# Patient Record
Sex: Female | Born: 2000 | Race: Black or African American | Hispanic: No | Marital: Single | Smoking: Never smoker
Health system: Southern US, Community
[De-identification: ages and names within clinical notes are randomized; demographics above are authoritative.]

## PROBLEM LIST (undated history)

## (undated) DIAGNOSIS — J302 Other seasonal allergic rhinitis: Secondary | ICD-10-CM

## (undated) DIAGNOSIS — Z789 Other specified health status: Secondary | ICD-10-CM

## (undated) HISTORY — PX: NO PAST SURGERIES: SHX2092

---
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
## 2021-01-02 ENCOUNTER — Other Ambulatory Visit: Payer: Self-pay

## 2021-01-02 ENCOUNTER — Other Ambulatory Visit (HOSPITAL_COMMUNITY)
Admission: RE | Admit: 2021-01-02 | Discharge: 2021-01-02 | Disposition: A | Discharge status: Home / Self Care | Payer: Medicaid Other | Source: Ambulatory Visit | Attending: Family Medicine | Admitting: Family Medicine

## 2021-01-02 ENCOUNTER — Encounter: Payer: Self-pay | Admitting: Family Medicine

## 2021-01-02 ENCOUNTER — Ambulatory Visit (INDEPENDENT_AMBULATORY_CARE_PROVIDER_SITE_OTHER): Payer: Medicaid Other | Admitting: Family Medicine

## 2021-01-02 VITALS — BP 124/84 | HR 87 | Wt 102.1 lb

## 2021-01-02 DIAGNOSIS — Z3490 Encounter for supervision of normal pregnancy, unspecified, unspecified trimester: Secondary | ICD-10-CM | POA: Diagnosis not present

## 2021-01-02 DIAGNOSIS — Z3491 Encounter for supervision of normal pregnancy, unspecified, first trimester: Secondary | ICD-10-CM

## 2021-01-02 LAB — POCT URINALYSIS DIP (DEVICE)
Bilirubin Urine: NEGATIVE
Glucose, UA: NEGATIVE mg/dL
Hgb urine dipstick: NEGATIVE
Ketones, ur: NEGATIVE mg/dL
Leukocytes,Ua: NEGATIVE
Nitrite: NEGATIVE
Protein, ur: NEGATIVE mg/dL
Specific Gravity, Urine: >=1.03 (ref 1.005–1.030)
Urobilinogen, UA: 0.2 mg/dL (ref 0.0–1.0)
pH: 6.5 (ref 5.0–8.0)

## 2021-01-02 NOTE — Progress Notes (Signed)
patient did not leave urine Lab.

## 2021-01-02 NOTE — Progress Notes (Signed)
FHR obtained via informal bedside ultrasound- FHR 171bpm

## 2021-01-02 NOTE — Progress Notes (Signed)
Home Medicaid Form completed  

## 2021-01-02 NOTE — Patient Instructions (Signed)
https://www.cdc.gov/pregnancy/infections.html">  First Trimester of Pregnancy  The first trimester of pregnancy starts on the first day of your last menstrual period until the end of week 12. This is also called months 1 through 3 of pregnancy. Body changes during your first trimester Your body goes through many changes during pregnancy. The changes usually return to normal after your baby is born. Physical changes  You may gain or lose weight.  Your breasts may grow larger and hurt. The area around your nipples may get darker.  Dark spots or blotches may develop on your face.  You may have changes in your hair. Health changes  You may feel like you might vomit (nauseous), and you may vomit.  You may have heartburn.  You may have headaches.  You may have trouble pooping (constipation).  Your gums may bleed. Other changes  You may get tired easily.  You may pee (urinate) more often.  Your menstrual periods will stop.  You may not feel hungry.  You may want to eat certain kinds of food.  You may have changes in your emotions from day to day.  You may have more dreams. Follow these instructions at home: Medicines  Take over-the-counter and prescription medicines only as told by your doctor. Some medicines are not safe during pregnancy.  Take a prenatal vitamin that contains at least 600 micrograms (mcg) of folic acid. Eating and drinking  Eat healthy meals that include: ? Fresh fruits and vegetables. ? Whole grains. ? Good sources of protein, such as meat, eggs, or tofu. ? Low-fat dairy products.  Avoid raw meat and unpasteurized juice, milk, and cheese.  If you feel like you may vomit, or you vomit: ? Eat 4 or 5 small meals a day instead of 3 large meals. ? Try eating a few soda crackers. ? Drink liquids between meals instead of during meals.  You may need to take these actions to prevent or treat trouble pooping: ? Drink enough fluids to keep your pee  (urine) pale yellow. ? Eat foods that are high in fiber. These include beans, whole grains, and fresh fruits and vegetables. ? Limit foods that are high in fat and sugar. These include fried or sweet foods. Activity  Exercise only as told by your doctor. Most people can do their usual exercise routine during pregnancy.  Stop exercising if you have cramps or pain in your lower belly (abdomen) or low back.  Do not exercise if it is too hot or too humid, or if you are in a place of great height (high altitude).  Avoid heavy lifting.  If you choose to, you may have sex unless your doctor tells you not to. Relieving pain and discomfort  Wear a good support bra if your breasts are sore.  Rest with your legs raised (elevated) if you have leg cramps or low back pain.  If you have bulging veins (varicose veins) in your legs: ? Wear support hose as told by your doctor. ? Raise your feet for 15 minutes, 3-4 times a day. ? Limit salt in your food. Safety  Wear your seat belt at all times when you are in a car.  Talk with your doctor if someone is hurting you or yelling at you.  Talk with your doctor if you are feeling sad or have thoughts of hurting yourself. Lifestyle  Do not use hot tubs, steam rooms, or saunas.  Do not douche. Do not use tampons or scented sanitary pads.  Do not   use herbal medicines, illegal drugs, or medicines that are not approved by your doctor. Do not drink alcohol.  Do not smoke or use any products that contain nicotine or tobacco. If you need help quitting, ask your doctor.  Avoid cat litter boxes and soil that is used by cats. These carry germs that can cause harm to the baby and can cause a loss of your baby by miscarriage or stillbirth. General instructions  Keep all follow-up visits. This is important.  Ask for help if you need counseling or if you need help with nutrition. Your doctor can give you advice or tell you where to go for help.  Visit your  dentist. At home, brush your teeth with a soft toothbrush. Floss gently.  Write down your questions. Take them to your prenatal visits. Where to find more information  American Pregnancy Association: americanpregnancy.org  American College of Obstetricians and Gynecologists: www.acog.org  Office on Women's Health: womenshealth.gov/pregnancy Contact a doctor if:  You are dizzy.  You have a fever.  You have mild cramps or pressure in your lower belly.  You have a nagging pain in your belly area.  You continue to feel like you may vomit, you vomit, or you have watery poop (diarrhea) for 24 hours or longer.  You have a bad-smelling fluid coming from your vagina.  You have pain when you pee.  You are exposed to a disease that spreads from person to person, such as chickenpox, measles, Zika virus, HIV, or hepatitis. Get help right away if:  You have spotting or bleeding from your vagina.  You have very bad belly cramping or pain.  You have shortness of breath or chest pain.  You have any kind of injury, such as from a fall or a car crash.  You have new or increased pain, swelling, or redness in an arm or leg. Summary  The first trimester of pregnancy starts on the first day of your last menstrual period until the end of week 12 (months 1 through 3).  Eat 4 or 5 small meals a day instead of 3 large meals.  Do not smoke or use any products that contain nicotine or tobacco. If you need help quitting, ask your doctor.  Keep all follow-up visits. This information is not intended to replace advice given to you by your health care provider. Make sure you discuss any questions you have with your health care provider. Document Revised: 04/30/2020 Document Reviewed: 03/06/2020 Elsevier Patient Education  2021 Elsevier Inc.  

## 2021-01-02 NOTE — Progress Notes (Signed)
     Subjective:   Melissa [name] is a 20 y.o. G1P0 at [redacted]w[redacted]d by LMP being seen today for her first obstetrical visit.  Her obstetrical history is significant for teen pregnancy. Patient unsure if intend to breast feed. Pregnancy history fully reviewed.  Patient reports no complaints.  HISTORY: OB History  Gravida Para Term Preterm AB Living  1 0 0 0 0 0  SAB IAB Ectopic Multiple Live Births  0 0 0 0 0    # Outcome Date GA Lbr Len/2nd Weight Sex Delivery Anes PTL Lv  1 Current            Last pap smear was: n/a, too young  Past Medical History:  Diagnosis Date  . Seasonal allergies    History reviewed. No pertinent surgical history. History reviewed. No pertinent family history. Social History   Tobacco Use  . Smoking status: Never Smoker  . Smokeless tobacco: Never Used  Vaping Use  . Vaping Use: Never used  Substance Use Topics  . Alcohol use: No  . Drug use: No   No Known Allergies Current Outpatient Medications on File Prior to Visit  Medication Sig Dispense Refill  . Prenatal Vit-Fe Fumarate-FA (PREPLUS) 27-1 MG TABS Take 1 tablet by mouth daily.    Marland Kitchen terconazole (TERAZOL 7) 0.4 % vaginal cream Place 1 applicator vaginally at bedtime. (Patient not taking: Reported on 01/02/2021) 45 g 0   No current facility-administered medications on file prior to visit.     Exam   Vitals:   01/02/21 1034  BP: 124/84  Pulse: 87  Weight: 102 lb 1.6 oz (46.3 kg)   Fetal Heart Rate (bpm): 171  Uterus:     System: General: well-developed, well-nourished female in no acute distress   Skin: normal coloration and turgor, no rashes   Neurologic: oriented, normal, negative, normal mood   Extremities: normal strength, tone, and muscle mass, ROM of all joints is normal   HEENT PERRLA, extraocular movement intact and sclera clear, anicteric   Mouth/Teeth mucous membranes moist, pharynx normal without lesions and dental hygiene good   Neck supple and no masses   Respiratory:  no  respiratory distress     Assessment:   Pregnancy: G1P0 Patient Active Problem List   Diagnosis Date Noted  . Supervision of low-risk pregnancy 01/02/2021     Plan:  1. Encounter for supervision of low-risk pregnancy, antepartum Initial labs drawn. Continue prenatal vitamins. Defer prenatal ASA.  Genetic Screening discussed, NIPS: ordered. Ultrasound discussed; fetal anatomic survey: ordered. Problem list reviewed and updated. The nature of [place] [facility] with multiple MDs and other Advanced Practice Providers was explained to patient; also emphasized that residents, students are part of our team. Routine obstetric precautions reviewed. - Genetic Screening - US MFM OB COMP + 14 WK; Future - Culture, OB Urine - [facility] AMB BABYSCRIPTS SCHEDULE OPTIMIZATION - Hemoglobin A1c - CBC/D/Plt+RPR+Rh+ABO+Rub Ab... - GC/Chlamydia probe amp ([facility])not at [facility]   Return in about 4 weeks (around 01/30/2021) for [facility], ob visit.

## 2021-01-03 LAB — CBC/D/PLT+RPR+RH+ABO+RUB AB...
Antibody Screen: NEGATIVE
Basophils Absolute: 0 10*3/uL (ref 0.0–0.2)
Basos: 1 %
EOS (ABSOLUTE): 0 10*3/uL (ref 0.0–0.4)
Eos: 0 %
HCV Ab: <0.1 s/co ratio (ref 0.0–0.9)
HIV Screen 4th Generation wRfx: NONREACTIVE
Hematocrit: 35.3 % (ref 34.0–46.6)
Hemoglobin: 11.6 g/dL (ref 11.1–15.9)
Hepatitis B Surface Ag: NEGATIVE
Immature Grans (Abs): 0 10*3/uL (ref 0.0–0.1)
Immature Granulocytes: 0 %
Lymphocytes Absolute: 1.4 10*3/uL (ref 0.7–3.1)
Lymphs: 18 %
MCH: 26.5 pg — ABNORMAL LOW (ref 26.6–33.0)
MCHC: 32.9 g/dL (ref 31.5–35.7)
MCV: 81 fL (ref 79–97)
Monocytes Absolute: 0.7 10*3/uL (ref 0.1–0.9)
Monocytes: 9 %
Neutrophils Absolute: 5.7 10*3/uL (ref 1.4–7.0)
Neutrophils: 72 %
Platelets: 277 10*3/uL (ref 150–450)
RBC: 4.38 x10E6/uL (ref 3.77–5.28)
RDW: 15.3 % (ref 11.7–15.4)
RPR Ser Ql: NONREACTIVE
Rh Factor: POSITIVE
Rubella Antibodies, IGG: 15.5 index (ref >0.99)
WBC: 7.8 10*3/uL (ref 3.4–10.8)

## 2021-01-03 LAB — HCV INTERPRETATION

## 2021-01-03 LAB — HEMOGLOBIN A1C
Est. average glucose Bld gHb Est-mCnc: 97 mg/dL
Hgb A1c MFr Bld: 5 % (ref 4.8–5.6)

## 2021-01-05 ENCOUNTER — Encounter: Payer: Self-pay | Admitting: *Deleted

## 2021-01-05 LAB — GC/CHLAMYDIA PROBE AMP (~~LOC~~) NOT AT ARMC
Chlamydia: NEGATIVE
Comment: NEGATIVE
Comment: NORMAL
Neisseria Gonorrhea: NEGATIVE

## 2021-01-07 LAB — CULTURE, OB URINE

## 2021-01-07 LAB — URINE CULTURE, OB REFLEX

## 2021-01-13 ENCOUNTER — Encounter: Payer: Self-pay | Admitting: Family Medicine

## 2021-01-30 ENCOUNTER — Encounter: Payer: Self-pay | Admitting: *Deleted

## 2021-01-30 ENCOUNTER — Ambulatory Visit (INDEPENDENT_AMBULATORY_CARE_PROVIDER_SITE_OTHER): Payer: Medicaid Other | Admitting: Advanced Practice Midwife

## 2021-01-30 ENCOUNTER — Other Ambulatory Visit: Payer: Self-pay

## 2021-01-30 VITALS — BP 109/64 | HR 78 | Wt 105.0 lb

## 2021-01-30 DIAGNOSIS — Z3A15 15 weeks gestation of pregnancy: Secondary | ICD-10-CM

## 2021-01-30 DIAGNOSIS — Z3402 Encounter for supervision of normal first pregnancy, second trimester: Secondary | ICD-10-CM

## 2021-01-30 DIAGNOSIS — Z23 Encounter for immunization: Secondary | ICD-10-CM | POA: Diagnosis not present

## 2021-01-30 MED ORDER — BLOOD PRESSURE KIT DEVI: 1.0000 | 0 refills | Status: DC | PRN

## 2021-01-30 NOTE — Patient Instructions (Signed)

## 2021-01-30 NOTE — Progress Notes (Signed)
Here for return ob. Would like flu vaccine. Has not signed up for [employer] yet, but will today. Has not received bp cuff yet; sent order in today to [facility] Charnell Peplinski,RN

## 2021-01-30 NOTE — Progress Notes (Signed)
   PRENATAL VISIT NOTE  Subjective:  Melissa [name] is a 20 y.o. G1P0 at [redacted]w[redacted]d being seen today for ongoing prenatal care.  She is currently monitored for the following issues for this low-risk pregnancy and has Supervision of low-risk pregnancy on their problem list.  Patient reports no complaints.  Contractions: Not present. Vag. Bleeding: None.  Movement: Absent. Denies leaking of fluid.   The following portions of the patient's history were reviewed and updated as appropriate: allergies, current medications, past family history, past medical history, past social history, past surgical history and problem list. Problem list updated.  Objective:   Vitals:   01/30/21 1000  BP: 109/64  Pulse: 78  Weight: 105 lb (47.6 kg)    Fetal Status: Fetal Heart Rate (bpm): 148   Movement: Absent     General:  Alert, oriented and cooperative. Patient is in no acute distress.  Skin: Skin is warm and dry. No rash noted.   Cardiovascular: Normal heart rate noted  Respiratory: Normal respiratory effort, no problems with respiration noted  Abdomen: Soft, gravid, appropriate for gestational age.  Pain/Pressure: Absent     Pelvic: Cervical exam deferred        Extremities: Normal range of motion.  Edema: None  Mental Status: Normal mood and affect. Normal behavior. Normal judgment and thought content.   Assessment and Plan:  Pregnancy: G1P0 at [redacted]w[redacted]d  1. Supervision of normal first teen pregnancy in second trimester - Routine care, declined AFP - Anatomy scan scheduled - Blood Pressure Monitoring (BLOOD PRESSURE KIT) DEVI; 1 Device by Does not apply route as needed.  Dispense: 1 each; Refill: 0 - Flu Vaccine QUAD 36+ mos IM  2. [redacted] weeks gestation of pregnancy   Preterm labor symptoms and general obstetric precautions including but not limited to vaginal bleeding, contractions, leaking of fluid and fetal movement were reviewed in detail with the patient. Please refer to After Visit Summary for  other counseling recommendations.  Return in about 4 weeks (around 02/27/2021).  Future Appointments  Date Time Provider [place]  02/27/2021 10:15 AM Danielle Rankin [facility] [place], [place]

## 2021-02-06 ENCOUNTER — Encounter: Payer: Self-pay | Admitting: *Deleted

## 2021-02-15 ENCOUNTER — Other Ambulatory Visit: Payer: Self-pay

## 2021-02-15 ENCOUNTER — Inpatient Hospital Stay (HOSPITAL_COMMUNITY)
Admission: AD | Admit: 2021-02-15 | Discharge: 2021-02-15 | Disposition: A | Discharge status: Home / Self Care | Payer: Medicaid Other | Attending: Family Medicine | Admitting: Family Medicine

## 2021-02-15 ENCOUNTER — Encounter (HOSPITAL_COMMUNITY): Payer: Self-pay | Admitting: Family Medicine

## 2021-02-15 ENCOUNTER — Inpatient Hospital Stay (HOSPITAL_BASED_OUTPATIENT_CLINIC_OR_DEPARTMENT_OTHER): Payer: Medicaid Other

## 2021-02-15 DIAGNOSIS — Z3A18 18 weeks gestation of pregnancy: Secondary | ICD-10-CM | POA: Diagnosis not present

## 2021-02-15 DIAGNOSIS — B3731 Acute candidiasis of vulva and vagina: Secondary | ICD-10-CM

## 2021-02-15 DIAGNOSIS — O26892 Other specified pregnancy related conditions, second trimester: Secondary | ICD-10-CM | POA: Insufficient documentation

## 2021-02-15 DIAGNOSIS — R1031 Right lower quadrant pain: Secondary | ICD-10-CM | POA: Diagnosis not present

## 2021-02-15 DIAGNOSIS — B379 Candidiasis, unspecified: Secondary | ICD-10-CM | POA: Diagnosis not present

## 2021-02-15 DIAGNOSIS — O321XX0 Maternal care for breech presentation, not applicable or unspecified: Secondary | ICD-10-CM

## 2021-02-15 DIAGNOSIS — O98812 Other maternal infectious and parasitic diseases complicating pregnancy, second trimester: Secondary | ICD-10-CM | POA: Diagnosis not present

## 2021-02-15 DIAGNOSIS — R102 Pelvic and perineal pain: Secondary | ICD-10-CM

## 2021-02-15 DIAGNOSIS — O26899 Other specified pregnancy related conditions, unspecified trimester: Secondary | ICD-10-CM

## 2021-02-15 DIAGNOSIS — R1030 Lower abdominal pain, unspecified: Secondary | ICD-10-CM | POA: Diagnosis not present

## 2021-02-15 DIAGNOSIS — B373 Candidiasis of vulva and vagina: Secondary | ICD-10-CM | POA: Diagnosis not present

## 2021-02-15 DIAGNOSIS — Z3492 Encounter for supervision of normal pregnancy, unspecified, second trimester: Secondary | ICD-10-CM

## 2021-02-15 HISTORY — DX: Other specified health status: Z78.9

## 2021-02-15 LAB — URINALYSIS, ROUTINE W REFLEX MICROSCOPIC
Bilirubin Urine: NEGATIVE
Glucose, UA: NEGATIVE mg/dL
Hgb urine dipstick: NEGATIVE
Ketones, ur: NEGATIVE mg/dL
Leukocytes,Ua: NEGATIVE
Nitrite: NEGATIVE
Protein, ur: NEGATIVE mg/dL
Specific Gravity, Urine: 1.018 (ref 1.005–1.030)
pH: 6 (ref 5.0–8.0)

## 2021-02-15 LAB — WET PREP, GENITAL
Clue Cells Wet Prep HPF POC: NONE SEEN
Sperm: NONE SEEN
Trich, Wet Prep: NONE SEEN

## 2021-02-15 MED ORDER — TERCONAZOLE 0.4 % VA CREA: 1.0000 | TOPICAL_CREAM | Freq: Every day | VAGINAL | 1 refills | Status: AC

## 2021-02-15 NOTE — MAU Note (Signed)
Pt presents to MAU via EMS with complaint of lower abdominal pain that started on Friday (02/13/21) @ 0300; only worse when laying on side or back. No LOF or vaginal bleeding reported. +FM reported. Pain rated 7 on 1-10 scale.

## 2021-02-15 NOTE — Discharge Instructions (Signed)
Round Ligament Pain    The round ligament is a cord of muscle and tissue that helps support the uterus. It can become a source of pain during pregnancy if it becomes stretched or twisted as the baby grows. The pain usually begins in the second trimester (13-28 weeks) of pregnancy, and it can come and go until the baby is delivered. It is not a serious problem, and it does not cause harm to the baby.  Round ligament pain is usually a short, sharp, and pinching pain, but it can also be a dull, lingering, and aching pain. The pain is felt in the lower side of the abdomen or in the groin. It usually starts deep in the groin and moves up to the outside of the hip area. The pain may occur when you:  · Suddenly change position, such as quickly going from a sitting to standing position.  · Roll over in bed.  · Cough or sneeze.  · Do physical activity.  Follow these instructions at home:    · Watch your condition for any changes.  · When the pain starts, relax. Then try any of these methods to help with the pain:  ? Sitting down.  ? Flexing your knees up to your abdomen.  ? Lying on your side with one pillow under your abdomen and another pillow between your legs.  ? Sitting in a warm bath for 15-20 minutes or until the pain goes away.  · Take over-the-counter and prescription medicines only as told by your health care provider.  · Move slowly when you sit down or stand up.  · Avoid long walks if they cause pain.  · Stop or reduce your physical activities if they cause pain.  · Keep all follow-up visits as told by your health care provider. This is important.  Contact a health care provider if:  · Your pain does not go away with treatment.  · You feel pain in your back that you did not have before.  · Your medicine is not helping.  Get help right away if:  · You have a fever or chills.  · You develop uterine contractions.  · You have vaginal bleeding.  · You have nausea or vomiting.  · You have diarrhea.  · You have pain  when you urinate.  Summary  · Round ligament pain is felt in the lower abdomen or groin. It is usually a short, sharp, and pinching pain. It can also be a dull, lingering, and aching pain.  · This pain usually begins in the second trimester (13-28 weeks). It occurs because the uterus is stretching with the growing baby, and it is not harmful to the baby.  · You may notice the pain when you suddenly change position, when you cough or sneeze, or during physical activity.  · Relaxing, flexing your knees to your abdomen, lying on one side, or taking a warm bath may help to get rid of the pain.  · Get help from your health care provider if the pain does not go away or if you have vaginal bleeding, nausea, vomiting, diarrhea, or painful urination.  This information is not intended to replace advice given to you by your health care provider. Make sure you discuss any questions you have with your health care provider.  Document Revised: 05/10/2018 Document Reviewed: 05/10/2018  Elsevier Patient Education © 2021 [employer]      Vaginal Yeast Infection, Adult    Vaginal yeast infection is   a condition that causes vaginal discharge as well as soreness, swelling, and redness (inflammation) of the vagina. This is a common condition. Some women get this infection frequently.  What are the causes?  This condition is caused by a change in the normal balance of the yeast (candida) and bacteria that live in the vagina. This change causes an overgrowth of yeast, which causes the inflammation.  What increases the risk?  The condition is more likely to develop in women who:  · Take antibiotic medicines.  · Have diabetes.  · Take birth control pills.  · Are pregnant.  · Douche often.  · Have a weak body defense system (immune system).  · Have been taking steroid medicines for a long time.  · Frequently wear tight clothing.  What are the signs or symptoms?  Symptoms of this condition include:  · White, thick, creamy vaginal  discharge.  · Swelling, itching, redness, and irritation of the vagina. The lips of the vagina (vulva) may be affected as well.  · Pain or a burning feeling while urinating.  · Pain during sex.  How is this diagnosed?  This condition is diagnosed based on:  · Your medical history.  · A physical exam.  · A pelvic exam. Your health care provider will examine a sample of your vaginal discharge under a microscope. Your health care provider may send this sample for testing to confirm the diagnosis.  How is this treated?  This condition is treated with medicine. Medicines may be over-the-counter or prescription. You may be told to use one or more of the following:  · Medicine that is taken by mouth (orally).  · Medicine that is applied as a cream (topically).  · Medicine that is inserted directly into the vagina (suppository).  Follow these instructions at home:    Lifestyle  · Do not have sex until your health care provider approves. Tell your sex partner that you have a yeast infection. That person should go to his or her health care provider and ask if they should also be treated.  · Do not wear tight clothes, such as pantyhose or tight pants.  · Wear breathable cotton underwear.  General instructions  · Take or apply over-the-counter and prescription medicines only as told by your health care provider.  · Eat more yogurt. This may help to keep your yeast infection from returning.  · Do not use tampons until your health care provider approves.  · Try taking a sitz bath to help with discomfort. This is a warm water bath that is taken while you are sitting down. The water should only come up to your hips and should cover your buttocks. Do this 3-4 times per day or as told by your health care provider.  · Do not douche.  · If you have diabetes, keep your blood sugar levels under control.  · Keep all follow-up visits as told by your health care provider. This is important.  Contact a health care provider if:  · You have a  fever.  · Your symptoms go away and then return.  · Your symptoms do not get better with treatment.  · Your symptoms get worse.  · You have new symptoms.  · You develop blisters in or around your vagina.  · You have blood coming from your vagina and it is not your menstrual period.  · You develop pain in your abdomen.  Summary  · Vaginal yeast infection is a condition that causes   provider. Make sure you discuss any questions you have with your health care provider. Document Revised: 06/22/2019 Document Reviewed: 04/10/2018 Elsevier Patient Education  2021 [employer].

## 2021-02-15 NOTE — MAU Provider Note (Signed)
Chief Complaint: Abdominal Pain   Event Date/Time   First Provider Initiated Contact with Patient 02/15/21 1107     SUBJECTIVE HPI: Melissa [name] is a 20 y.o. G1P0 at [redacted]w[redacted]d who presents to Maternity Admissions reporting bilateral lower abdominal pain and pressure since 02/14/2021 at 3 AM.  Location: Bilateral abdomen Quality: Pressure lying still and sharp pains with movement Severity: 8/10 on pain scale at worst 5/10 now. Duration: 30 hours Context: [redacted] weeks gestation.  Uncomplicated pregnancy Timing: Intermittent Modifying factors: Worse with lying down.  Improved with ambulation. Associated signs and symptoms: Negative for fever, chills, urinary complaints, GI complaints, vaginal bleeding or vaginal discharge.  Past Medical History:  Diagnosis Date  . Medical history non-contributory   . Seasonal allergies    OB History  Gravida Para Term Preterm AB Living  1            SAB IAB Ectopic Multiple Live Births               # Outcome Date GA Lbr Len/2nd Weight Sex Delivery Anes PTL Lv  1 Current            Past Surgical History:  Procedure Laterality Date  . NO PAST SURGERIES     Social History   Socioeconomic History  . Marital status: Single    Spouse name: Not on file  . Number of children: Not on file  . Years of education: Not on file  . Highest education level: Not on file  Occupational History  . Not on file  Tobacco Use  . Smoking status: Never Smoker  . Smokeless tobacco: Never Used  Vaping Use  . Vaping Use: Never used  Substance and Sexual Activity  . Alcohol use: No  . Drug use: No  . Sexual activity: Yes    Birth control/protection: None  Other Topics Concern  . Not on file  Social History Narrative  . Not on file   Social Determinants of Health   Financial Resource Strain: Not on file  Food Insecurity: No Food Insecurity  . Worried About Charity fundraiser in the Last Year: Never true  . Ran Out of Food in the Last Year: Never true   Transportation Needs: No Transportation Needs  . Lack of Transportation (Medical): No  . Lack of Transportation (Non-Medical): No  Physical Activity: Not on file  Stress: Not on file  Social Connections: Not on file  Intimate Partner Violence: Not At Risk  . Fear of Current or Ex-Partner: No  . Emotionally Abused: No  . Physically Abused: No  . Sexually Abused: No   History reviewed. No pertinent family history. No current facility-administered medications on file prior to encounter.   Current Outpatient Medications on File Prior to Encounter  Medication Sig Dispense Refill  . Prenatal Vit-Fe Fumarate-FA (PREPLUS) 27-1 MG TABS Take 1 tablet by mouth daily.    . Blood Pressure Monitoring (BLOOD PRESSURE KIT) DEVI 1 Device by Does not apply route as needed. 1 each 0   No Known Allergies  I have reviewed patient's Past Medical Hx, Surgical Hx, Family Hx, Social Hx, medications and allergies.   Review of Systems  Constitutional: Negative for appetite change, chills and fever.  Gastrointestinal: Positive for abdominal pain. Negative for abdominal distention, constipation, diarrhea, nausea and vomiting.  Genitourinary: Positive for pelvic pain. Negative for difficulty urinating, dyspareunia, dysuria, flank pain, frequency, hematuria, urgency, vaginal bleeding and vaginal discharge.  Musculoskeletal: Negative for back pain.  OBJECTIVE Patient Vitals for the past 24 hrs:  BP Temp Temp src Pulse Resp SpO2 Height Weight  02/15/21 1057 102/70 98.2 F (36.8 C) Oral 77 18 100 % -- --  02/15/21 1056 -- -- -- -- -- -- $Rem'5\' 1"'LCTZ$  (1.549 m) 47.2 kg  02/15/21 1040 101/62 98.8 F (37.1 C) Oral 70 19 100 % -- --   Constitutional: Well-developed, well-nourished female in no acute distress.  Cardiovascular: normal rate Respiratory: normal rate and effort.  GI: Abd soft, non-tender, gravid appropriate for gestational age. Pos BS x 4 MS: Extremities nontender, no edema, normal ROM Neurologic:  Alert and oriented x 4.  GU: Neg CVAT.  SPECULUM EXAM: NEFG, small amount of thick, white, odorless discharge, no blood noted, cervix clean  BIMANUAL: cervix closed, unable to assess placement; uterus 18-week size, moderate right adnexal tenderness no mass.  No left adnexal tenderness or masses. No CMT.  LAB RESULTS Results for orders placed or performed during the hospital encounter of 02/15/21 (from the past 24 hour(s))  Urinalysis, Routine w reflex microscopic Urine, Clean Catch     Status: Abnormal   Collection Time: 02/15/21 11:45 AM  Result Value Ref Range   Color, Urine YELLOW YELLOW   APPearance HAZY (A) CLEAR   Specific Gravity, Urine 1.018 1.005 - 1.030   pH 6.0 5.0 - 8.0   Glucose, UA NEGATIVE NEGATIVE mg/dL   Hgb urine dipstick NEGATIVE NEGATIVE   Bilirubin Urine NEGATIVE NEGATIVE   Ketones, ur NEGATIVE NEGATIVE mg/dL   Protein, ur NEGATIVE NEGATIVE mg/dL   Nitrite NEGATIVE NEGATIVE   Leukocytes,Ua NEGATIVE NEGATIVE  Wet prep, genital     Status: Abnormal   Collection Time: 02/15/21 11:48 AM   Specimen: PATH Cytology Cervicovaginal Ancillary Only; Genital  Result Value Ref Range   Yeast Wet Prep HPF POC PRESENT (A) NONE SEEN   Trich, Wet Prep NONE SEEN NONE SEEN   Clue Cells Wet Prep HPF POC NONE SEEN NONE SEEN   WBC, Wet Prep HPF POC MANY (A) NONE SEEN   Sperm NONE SEEN     IMAGING  Media Information             MAU COURSE Orders Placed This Encounter  Procedures  . Wet prep, genital  . [place] MFM OB TRANSVAGINAL  . [place] MFM OB Limited  . Urinalysis, Routine w reflex microscopic Urine, Clean Catch  . Discharge patient   Meds ordered this encounter  Medications  . terconazole (TERAZOL 7) 0.4 % vaginal cream    Sig: Place 1 applicator vaginally at bedtime for 7 days.    Dispense:  45 g    Refill:  1    X 7 days    Order Specific Question:   Supervising Provider    Answer:   Donnamae Jude [[id number]]    MDM -18.1 weeks with intermittent lower  abdominal pain most likely due to round ligament pain and possible sporadic contractions (contraction observed on ultrasound).  No evidence of cervical shortening, active preterm labor or other emergent obstetric condition.  Normal ovaries, UA.  Positive for yeast.  Will treat with Terazol 7.  Preterm labor precautions reviewed.  ASSESSMENT 1. Vaginal yeast infection   2. RLQ abdominal pain   3. Abdominal pain during pregnancy in second trimester   4. Pain of round ligament affecting pregnancy, antepartum   5. Encounter for supervision of low-risk pregnancy in second trimester     PLAN Discharge home in stable condition. Second trimester and preterm  labor precautions Push fluids.  Measures, Tylenol, ibuprofen as needed (do not use ibuprofen greater than 3 days or after 28 weeks)  [place] [facility] Follow up on 02/24/2021.   Specialty: Obstetrics and Gynecology Why: as scheduled or sooner as needed if symptoms worsen Contact information: [address] [place] [address] [phone]       [facility] Follow up.   Specialty: Obstetrics and Gynecology Why: as needed in pregnancy emergencies Contact information: [address] [phone] [place] [phone]             Allergies as of 02/15/2021   No Known Allergies     Medication List    TAKE these medications   Blood Pressure Kit Devi 1 Device by Does not apply route as needed.   PrePLUS 27-1 MG Tabs Take 1 tablet by mouth daily.   terconazole 0.4 % vaginal cream Commonly known as: Terazol 7 Place 1 applicator vaginally at bedtime for 7 days.        Tamala Julian, [place], [place] 02/15/2021  1:32 PM

## 2021-02-16 LAB — GC/CHLAMYDIA PROBE AMP (~~LOC~~) NOT AT ARMC
Chlamydia: NEGATIVE
Comment: NEGATIVE
Comment: NORMAL
Neisseria Gonorrhea: NEGATIVE

## 2021-02-24 ENCOUNTER — Other Ambulatory Visit: Payer: Self-pay

## 2021-02-24 ENCOUNTER — Other Ambulatory Visit: Payer: Self-pay | Admitting: *Deleted

## 2021-02-24 ENCOUNTER — Ambulatory Visit: Payer: Medicaid Other | Attending: Family Medicine

## 2021-02-24 DIAGNOSIS — Z3490 Encounter for supervision of normal pregnancy, unspecified, unspecified trimester: Secondary | ICD-10-CM | POA: Insufficient documentation

## 2021-02-24 DIAGNOSIS — Z3400 Encounter for supervision of normal first pregnancy, unspecified trimester: Secondary | ICD-10-CM

## 2021-02-27 ENCOUNTER — Encounter: Payer: Self-pay | Admitting: Medical

## 2021-02-27 ENCOUNTER — Ambulatory Visit (INDEPENDENT_AMBULATORY_CARE_PROVIDER_SITE_OTHER): Payer: Medicaid Other | Admitting: Medical

## 2021-02-27 ENCOUNTER — Other Ambulatory Visit: Payer: Self-pay

## 2021-02-27 VITALS — BP 118/77 | HR 86 | Wt 113.8 lb

## 2021-02-27 DIAGNOSIS — Z3490 Encounter for supervision of normal pregnancy, unspecified, unspecified trimester: Secondary | ICD-10-CM

## 2021-02-27 DIAGNOSIS — Z3A19 19 weeks gestation of pregnancy: Secondary | ICD-10-CM

## 2021-02-27 NOTE — Patient Instructions (Addendum)
AREA PEDIATRIC/FAMILY PRACTICE PHYSICIANS  [place] ([address]) . [facility] o Chambliss, MD; Eniola, MD; Hale, MD; Hensel, MD; McDiarmid, MD; McIntyer, MD; Neal, MD; Walden, MD o [address] [place], [place] [address] o [phone] o Mon-Fri 8:30-12:30, 1:30-5:00 o Providers come to see babies at [facility] o Accepting Medicaid . [facility] o Limited providers who accept newborns: Koirala, MD; Morrow, MD; Wolters, MD o [address], [place], [place] [address] o [phone] o Mon-Fri 8:00-5:30 o Babies seen by providers at [facility] o Does NOT accept Medicaid o Please call early in hospitalization for appointment (limited availability)  . [facility] o Mulberry, MD o [address] [place], [place] [address] o [phone] o Mon, Tue, Thur, Fri 8:30-5:00, Wed 10:00-7:00 (closed 1-2pm) o Babies seen by [facility] providers o Accepting Medicaid . [facility] - Pediatrician o Rubin, MD o [address], [place], [place] [address] o [phone] o Mon-Fri 8:30-5:00, Sat 8:30-12:00 o Provider comes to see babies at [facility] o Accepting Medicaid o Must have been referred from current patients or contacted office prior to delivery . [facility] ([facility]) o Brown, MD; Chandler, MD; Ettefagh, MD; Grant, MD; Lester, MD; McCormick, MD; McQueen, MD; Prose, MD; Simha, MD; Stanley, MD; Stryffeler, NP; Tebben, NP o [address], [place], [place] [address] o [phone] o Mon, Tue, Thur, Fri 8:30-5:30, Wed 9:30-5:30, Sat 8:30-12:30 o Babies seen by [facility] providers o Accepting Medicaid o Only accepting infants of first-time parents or siblings of current patients o Hospital discharge coordinator will make follow-up appointment . Jack Amos o [address],  [place], [place]  [address] o [phone]   Fax - [phone] . [facility] o [address], [place], [place]  [address] o Phone - [phone]   Fax - [phone] . Shilpa Gosrani o [address], [place], [place]  [address] o [phone]  [place] ([address]) . [facility] o Bates, MD; Brassfield, MD; Cooper, Cox, MD; MD; Davis, MD; Dovico, MD; Ettefaugh, MD; Little, MD; Lowe, MD; Keiffer, MD; Melvin, MD; Sumner, MD; Williams, MD o [address], [place], [place] [address] o [phone] o Mon-Fri 8:30-5:00 (extended evenings Mon-Thur as needed), Sat-Sun 10:00-1:00 o Providers come to see babies at [facility] o Accepting Medicaid for families of first-time babies and families with all children in the household age 3 and under. Must register with office prior to making appointment (M-F only). . [facility] o Henson, NP; Knapp, MD; Lalonde, MD; Tysinger, PA o [address] [place], [place] [address] o [phone] o Mon-Fri 8:00-5:00 o Babies seen by providers at [facility] o Does NOT accept Medicaid/Commercial Insurance Only . [facility]  o Artis, MD; Barnes, MD; Bratton, MD; Coccaro, MD; Lockett Gardner, MD; Kramer, MD; Marshall, MD; Netherton, MD; Poleto, MD; Skinner, MD o [address] [place], [place] [address] o [phone] o Mon-Fri 8:30-5:30, Sat (Oct.-Mar.) 9:00-1:00 o Babies seen by providers at [facility] o Accepting Medicaid  [place] ([address]) . [facility] o Reid, MD; Warner, MD o [address], [place],  [address] o [phone] o Mon-Fri 8:30-5:00, Sat 8:30-12:00 o Providers come to see babies at [facility] o Does NOT accept Medicaid . [facility] o Becker, PA; Hagler, MD; Scifres, PA; Sun, MD; Swayne, MD o [address],  [place], [place] [address] o [phone] o Mon-Fri 8:00-5:00 o Babies seen by providers at [facility] o Does NOT accept Medicaid o Only accepting babies of parents who are patients o Please call early in hospitalization for appointment (limited availability) . [facility] o Clark, MD; Frye, MD; Kelleher, MD; Mack, NP; Miller, MD; O'Keller, MD; Patterson, NP; Pudlo, MD; Puzio, MD; Thomas, MD; Tucker, MD; Twiselton, MD o [address], [place], [place] [address] o [phone] o Mon-Fri 8:00-5:00, Sat 9:00-12:00 o Providers come to see babies at [facility] o Does NOT accept Medicaid  [place] ([address]) . [facility] o Limited providers accepting new patients: Brake, NP; Wharton, PA o [address], [place], [place] [address] o [phone] o Mon-Fri 8:00-5:00 o Babies seen by providers at [facility] o Does NOT accept Medicaid o Only accepting babies of parents who are patients o Please call early in hospitalization for appointment (limited availability) . [facility] o Gay, MD; Quinlan, MD o [address] [place], [place] [address] o [phone] (press 1 to schedule appointment) o Mon-Fri 8:00-5:00 o Providers come to see babies at [facility] o Does NOT accept Medicaid . [facility] o Mazer, MD o [address] [place], [place] [address] o [phone] o Mon-Fri 8:30-5:00 (lunch 12:30-1:00), extended hours by appointment only Wed 5:00-6:30 o Babies seen by [facility] providers o Accepting Medicaid . [facility] o Banks, MD; Jordan, MD; Koberlein, MD o [address], [place], [place] [address] o [phone] o Mon-Fri 8:00-5:00 o Babies seen by [facility] providers o Does NOT accept Medicaid . [facility] o Parker, MD; Hunter, MD; Wallace, DO o [address] [place], [place]  [address] o [phone] o Mon-Fri 8:00-5:00 o Babies seen by [facility] providers o Does NOT accept Medicaid . [facility] o Brandon, PA; Brecken, PA; Christy, NP; Dees, MD; DeClaire, MD; DeWeese, MD; Hansen, NP; Mills, NP; Parrish, NP; Smoot, NP; Summer, MD; Vapne, MD o [address] [place], [place] [address] o [phone] o Mon-Fri 8:30-5:00, Sat 10:00-1:00 o Providers come to see babies at [facility] o Does NOT accept Medicaid o Free prenatal information session Tuesdays at 4:45pm . [facility] o Bouska, MD; Gordon, PA; Jeffery, PA; Weber, PA o [address] [place] [address] o [phone] o Mon-Fri 7:30-5:30 o Babies seen by [facility] providers . [facility] o [address], [place], [place], [place]  [address] o [phone]   Fax - [phone]  [place] ([phone]) . [facility] o Reese, MD o [address] [place], [place] [address] o [phone] o Mon-Thur 8:00-6:00 o Providers come to see babies at [facility] o Accepting Medicaid . [facility] o Anderson, NP; Badger, MD; Beal, PA; Spencer, PA o [address] [place],  [address] o [phone] o Mon-Thur 7:30-7:30, Fri 7:30-4:30 o Babies seen by [facility] providers o Accepting Medicaid . [facility] o Agbuya, MD; Klett, NP; Romgoolam, MD o [address], ,  [address] o [phone] o Mon-Fri 8:30-5:00, Sat 8:30-12:00 o Providers come to see babies at [facility] o Accepting Medicaid o Must have "Meet & Greet" appointment at office prior to delivery . [facility]  ([facility] ) o McCord,   MD; Juleen China, MD; Clydene Laming, [address], [place], [place] [address] o [phone] o Mon-Wed 8:00-6:00, Thur-Fri 8:00-5:00, Sat 9:00-12:00 o Providers come to  see babies at [facility] o Does NOT accept Medicaid o Only accepting siblings of current patients . [facility] [place], [place], [place], [place]  [address] o [phone]   Fax [phone] . [facility] [place] [address], [place], [place]  [address] o [phone]   Fax - Morton Gorman [phone] & [phone]) . Therapist, music at [place], DO; [place], [address] [place], [place] [address] o [phone] o Mon-Fri 7:00-5:00 o Babies seen by [facility] providers o Does NOT accept Medicaid . [place], MD; [place], [place]; [place], [place], [place], [place] [address] o [phone] o Mon-Fri 8:00-5:00 o Babies seen by [facility] providers o Accepting Medicaid . Lamont, MD; [place], [place]; [place], NP; [place], [place], [place], [place] [address] o [phone] o Mon-Fri 8:00-5:00 o Babies seen by providers at [facility] [place] [phone]) . [facility] [place], [place] o [address] [place], [place] [address] o [phone] o Mon-Fri 8:00-5:00 o Babies seen by [facility] providers o Does NOT accept Medicaid o Limited availability, please call early in hospitalization to schedule follow-up . [facility] Leilani Merl, PA; Maisie Fus, MD; [place], MD; [place], [place]; Jeannine Kitten, MD; [place], [place] [facility] [address], [place], [place] [address] o [phone] o Mon-Fri 8:30-5:00, Sat 9:00-12:00 o Babies seen by providers at [facility] o Accepting Medicaid o Please register online then schedule online or call office o [email or web address] . [place] [facility]  [place]) Kristian Covey, NP; Dwyane Dee, MD; Leonidas Romberg, PA o [address] [place], [place], [place] [address] o [phone] o Mon-Fri 8:00-5:00 o Babies seen by providers at [facility] o Accepting Medicaid . [place] [facility]) Dairl Ponder, MD; Rayvon Char, NP; Melina Modena, MD o [address] [place], [place], [place] [address] o [phone] o Mon-Fri 8:00-5:30, Sat&Sun by appointment (phones open at 8:30) o Babies seen by [facility] providers o Accepting Medicaid o Must be a first-time baby or sibling of current patient . [place], [address], [place], [place]  [address] o [phone]   Fax - [phone]  [place] [phone] & [phone]) . [place], [place]; [place], [place]; Benjamine Mola, MD; [place], [place]; Harrell Lark, MD o [address] [place], [place] [address] o [phone] o Mon-Thur 8:00-7:00, Fri 8:00-5:00, Sat 8:00-12:00, Sun 9:00-12:00 o Babies seen by [facility] providers o Accepting Medicaid . [facility], MD; Ruthann Cancer, MD; [facility], MD o [address], [place], [place] [address] o [phone] o Mon-Thur 8:00-5:00 o Babies seen by providers at [facility] o Accepting Medicaid . [facility] [place], MD; Coe-Goins, MD; Amedeo Plenty, MD; Bobby Rumpf, MD; List, MD; Lavonia Drafts, MD; Ruthann Cancer, MD; Selinda Eon, MD; Audie Box, MD; Jim Like, MD; Christie Nottingham, MD; Hubbard Hartshorn, MD; Modena Nunnery, MD o [address] [place], [place]  [address] o [phone] o Mon-Fri 8:00-5:30, Sat (Oct.-Mar.) 9:00-1:00 o Babies seen by providers at [facility] o Accepting Medicaid o Must fill out new patient packet, available online at [email or web address] . [facility] [place] [facility]) Simone Curia, NP; Tiburcio Pea, NP; Tresa Endo, NP; Whitney Post, MD;  [place], [place]; Hennie Duos, MD; Wynne Dust, MD; Kavin Leech, NP o [address], [place], [place] [address] o [phone] o Mon-Thur 8:00-5:30, Fri 8:00-5:00 o Babies seen by providers at [facility] o Accepting [facility] [phone]) . [facility] o [place], [place]; Lamont, MD; Tanya Nones, MD; [place], [place] o [address] [place], [place] [address] o [phone] o Mon-Fri 8:00-5:00 o Babies seen by providers at [facility] o Accepting [facility] [phone]) . [facility] o [place], DO; Lenise Arena, MD; [place], [place] o [address], [place], [place] [address] o [phone] o Mon-Fri 8:00-5:00 o Babies seen by providers at [facility] o Does NOT accept Medicaid o Limited appointment availability, please call early in hospitalization  . Nature conservation officer at [facility] o Oneida, DO; Hayward, MD o [address], [place], [place] [address] o [phone] o Mon-Fri 8:00-5:00 o Babies seen by [facility] providers o Does NOT accept Medicaid . [facility] Lorrine Kin, MD; Ninetta Lights, MD; [place], [place]; Willard, MD o [address], [place], [place] [address] o [phone] o Mon-Fri 8:00-5:00 o After hours clinic [facility] [place], [place] [address]) [phone] Mon-Fri 5:00-8:00, Sat 12:00-6:00, Sun 10:00-4:00 o Babies seen by [facility] providers o Accepting Medicaid . [facility] at [facility] o [address], [place], [place]  [address] o [phone]   Fax - [phone]  [place] [phone]) . Nature conservation officer at [facility], MD o [address] [place], [place], [place] [address] o [phone] o Mon-Fri 8:00-5:00 o Babies seen by [facility] providers o Does NOT accept Medicaid . [facility] [place] [facility] [place]) Tomi Likens, MD o [address] [place] [address] [place], [place]  [address] o [phone] o Mon-Thur 8:00-7:00, Fri 8:00-5:00, Sat 8:00-12:00 o Babies seen by providers at [facility] o Accepting Medicaid - but does not have vaccinations in office (must be received elsewhere) o Limited availability, please call early in hospitalization  [place] ([address]) . [facility]  o Wyvonne Lenz, MD o [address], [place] [address] o [phone]  Fax [phone]  Second Trimester of Pregnancy  The second trimester of pregnancy is from week 13 through week 27. This is also called months 4 through 6 of pregnancy. This is often the time when you feel your best. During the second trimester:  Morning sickness is less or has stopped.  You may have more energy.  You may feel hungry more often. At this time, your unborn baby (fetus) is growing very fast. At the end of the sixth month, the unborn baby may be up to 12 inches long and weigh about 1 pounds. You will likely start to feel the baby move between 16 and 20 weeks of pregnancy. Body changes during your second trimester Your body continues to go through many changes during this time. The changes vary and generally return to normal after the baby is born. Physical changes  You will gain more weight.  You may start to get stretch marks on your hips, belly (abdomen),  and breasts.  Your breasts will grow and may hurt.  Dark spots or blotches may develop on your face.  A dark line from your belly button to the pubic area (linea nigra) may appear.  You may have changes in your hair. Health changes  You may have headaches.  You may have heartburn.  You may have trouble pooping (constipation).  You may have hemorrhoids or swollen, bulging veins (varicose veins).  Your gums may bleed.  You may pee (urinate) more often.  You may have back pain. Follow these instructions at home: Medicines  Take over-the-counter and prescription medicines only as told by your doctor.  Some medicines are not safe during pregnancy.  Take a prenatal vitamin that contains at least 600 micrograms (mcg) of folic acid. Eating and drinking  Eat healthy meals that include: ? Fresh fruits and vegetables. ? Whole grains. ? Good sources of protein, such as meat, eggs, or tofu. ? Low-fat dairy products.  Avoid raw meat and unpasteurized juice, milk, and cheese.  You may need to take these actions to prevent or treat trouble pooping: ? Drink enough fluids to keep your pee (urine) pale yellow. ? Eat foods that are high in fiber. These include beans, whole grains, and fresh fruits and vegetables. ? Limit foods that are high in fat and sugar. These include fried or sweet foods. Activity  Exercise only as told by your doctor. Most people can do their usual exercise during pregnancy. Try to exercise for 30 minutes at least 5 days a week.  Stop exercising if you have pain or cramps in your belly or lower back.  Do not exercise if it is too hot or too humid, or if you are in a place of great height (high altitude).  Avoid heavy lifting.  If you choose to, you may have sex unless your doctor tells you not to. Relieving pain and discomfort  Wear a good support bra if your breasts are sore.  Take warm water baths (sitz baths) to soothe pain or discomfort caused by hemorrhoids. Use hemorrhoid cream if your doctor approves.  Rest with your legs raised (elevated) if you have leg cramps or low back pain.  If you develop bulging veins in your legs: ? Wear support hose as told by your doctor. ? Raise your feet for 15 minutes, 3-4 times a day. ? Limit salt in your food. Safety  Wear your seat belt at all times when you are in a car.  Talk with your doctor if someone is hurting you or yelling at you a lot. Lifestyle  Do not use hot tubs, steam rooms, or saunas.  Do not douche. Do not use tampons or scented sanitary pads.  Avoid cat litter boxes and soil used by cats. These  carry germs that can harm your baby and can cause a loss of your baby by miscarriage or stillbirth.  Do not use herbal medicines, illegal drugs, or medicines that are not approved by your doctor. Do not drink alcohol.  Do not smoke or use any products that contain nicotine or tobacco. If you need help quitting, ask your doctor. General instructions  Keep all follow-up visits. This is important.  Ask your doctor about local prenatal classes.  Ask your doctor about the right foods to eat or for help finding a counselor. Where to find more information  American Pregnancy Association: americanpregnancy.org  [employer] of Obstetricians and Gynecologists: www.acog.org  Office on Women's Health: MightyReward.co.nz Contact a doctor if:  You have a headache that does not go away when you take medicine.  You have changes in how you see, or you see spots in front of your eyes.  You have mild cramps, pressure, or pain in your lower belly.  You continue to feel like you may vomit (nauseous), you vomit, or you have watery poop (diarrhea).  You have bad-smelling fluid coming from your vagina.  You have pain when you pee or your pee smells bad.  You have very bad swelling of your face, hands, ankles, feet, or legs.  You have a fever. Get help right away if:  You are leaking fluid from your vagina.  You have spotting or bleeding from your vagina.  You have very bad belly cramping or pain.  You have trouble breathing.  You have chest pain.  You faint.  You have not felt your baby move for the time period told by your doctor.  You have new or increased pain, swelling, or redness in an arm or leg. Summary  The second trimester of pregnancy is from week 13 through week 27 (months 4 through 6).  Eat healthy meals.  Exercise as told by your doctor. Most people can do their usual exercise during pregnancy.  Do not use herbal medicines, illegal drugs, or medicines  that are not approved by your doctor. Do not drink alcohol.  Call your doctor if you get sick or if you notice anything unusual about your pregnancy. This information is not intended to replace advice given to you by your health care provider. Make sure you discuss any questions you have with your health care provider. Document Revised: 04/30/2020 Document Reviewed: 03/06/2020 Elsevier Patient Education  2021 [employer].

## 2021-02-27 NOTE — Progress Notes (Signed)
   PRENATAL VISIT NOTE  Subjective:  Melissa [name] is a 20 y.o. G1P0 at [redacted]w[redacted]d being seen today for ongoing prenatal care.  She is currently monitored for the following issues for this low-risk pregnancy and has Supervision of low-risk pregnancy on their problem list.  Patient reports no complaints.  Contractions: Not present. Vag. Bleeding: None.  Movement: Present. Denies leaking of fluid.   The following portions of the patient's history were reviewed and updated as appropriate: allergies, current medications, past family history, past medical history, past social history, past surgical history and problem list.   Objective:   Vitals:   02/27/21 1054  BP: 118/77  Pulse: 86  Weight: 113 lb 12.8 oz (51.6 kg)    Fetal Status: Fetal Heart Rate (bpm): 152 Fundal Height: 20 cm Movement: Present     General:  Alert, oriented and cooperative. Patient is in no acute distress.  Skin: Skin is warm and dry. No rash noted.   Cardiovascular: Normal heart rate noted  Respiratory: Normal respiratory effort, no problems with respiration noted  Abdomen: Soft, gravid, appropriate for gestational age.  Pain/Pressure: Absent     Pelvic: Cervical exam deferred        Extremities: Normal range of motion.  Edema: None  Mental Status: Normal mood and affect. Normal behavior. Normal judgment and thought content.   Assessment and Plan:  Pregnancy: G1P0 at [redacted]w[redacted]d 1. Encounter for supervision of low-risk pregnancy, antepartum - AFP, Serum, Open Spina Bifida - Peds list given and importance of choosing peds prior to delivery discussed  - POPs planned for MOC  2. [redacted] weeks gestation of pregnancy  Preterm labor symptoms and general obstetric precautions including but not limited to vaginal bleeding, contractions, leaking of fluid and fetal movement were reviewed in detail with the patient. Please refer to After Visit Summary for other counseling recommendations.   Return in about 4 weeks (around  03/27/2021) for LOB, In-Person.  Future Appointments  Date Time Provider Department Center  03/27/2021  9:35 AM [facility]borne O[facility]  05/19/2021 10:45 AM [facility] NURSE [facility]  05/19/2021 11:00 AM [facility] US1 [facility]    Vonzella Nipple, PA-C

## 2021-03-01 LAB — AFP, SERUM, OPEN SPINA BIFIDA
AFP MoM: 1.66
AFP Value: 111.1 ng/mL
Gest. Age on Collection Date: 19 weeks
Maternal Age At EDD: 18.6 yr
OSBR Risk 1 IN: 3604
Test Results:: NEGATIVE
Weight: 113 [lb_av]

## 2021-03-27 ENCOUNTER — Other Ambulatory Visit: Payer: Self-pay

## 2021-03-27 ENCOUNTER — Ambulatory Visit (INDEPENDENT_AMBULATORY_CARE_PROVIDER_SITE_OTHER): Payer: Medicaid Other | Admitting: Certified Nurse Midwife

## 2021-03-27 VITALS — BP 107/72 | HR 85 | Wt 117.6 lb

## 2021-03-27 DIAGNOSIS — Z3A23 23 weeks gestation of pregnancy: Secondary | ICD-10-CM

## 2021-03-27 DIAGNOSIS — Z3402 Encounter for supervision of normal first pregnancy, second trimester: Secondary | ICD-10-CM

## 2021-03-27 NOTE — Progress Notes (Signed)
   PRENATAL VISIT NOTE  Subjective:  Melissa [name] is a 20 y.o. G1P0 at [redacted]w[redacted]d being seen today for ongoing prenatal care.  She is currently monitored for the following issues for this low-risk pregnancy and has Supervision of low-risk pregnancy on their problem list.  Patient reports no complaints.  Contractions: Not present. Vag. Bleeding: None.  Movement: Present. Denies leaking of fluid.   The following portions of the patient's history were reviewed and updated as appropriate: allergies, current medications, past family history, past medical history, past social history, past surgical history and problem list.   Objective:   Vitals:   03/27/21 0938  BP: 107/72  Pulse: 85  Weight: 117 lb 9.6 oz (53.3 kg)    Fetal Status: Fetal Heart Rate (bpm): 151 Fundal Height: 24 cm Movement: Present     General:  Alert, oriented and cooperative. Patient is in no acute distress.  Skin: Skin is warm and dry. No rash noted.   Cardiovascular: Normal heart rate noted  Respiratory: Normal respiratory effort, no problems with respiration noted  Abdomen: Soft, gravid, appropriate for gestational age.  Pain/Pressure: Present     Pelvic: Cervical exam deferred        Extremities: Normal range of motion.  Edema: None  Mental Status: Normal mood and affect. Normal behavior. Normal judgment and thought content.   Assessment and Plan:  Pregnancy: G1P0 at [redacted]w[redacted]d 1. Supervision of low-risk first pregnancy, second trimester - Doing well, feeling plenty of fetal movement and nausea has eased  2. [redacted] weeks gestation of pregnancy - Routine OB care - Anticipatory guidance re GTT at next visit  Preterm labor symptoms and general obstetric precautions including but not limited to vaginal bleeding, contractions, leaking of fluid and fetal movement were reviewed in detail with the patient. Please refer to After Visit Summary for other counseling recommendations.   Return in about 3 weeks (around 04/17/2021) for  IN-PERSON, LOB w GTT.  Future Appointments  Date Time Provider Department Center  04/17/2021  8:35 AM Currie Paris, NP [facility]  05/19/2021 10:45 AM [facility] NURSE [facility]  05/19/2021 11:00 AM [facility] US1 [facility]    Bernerd Limbo, CNM

## 2021-03-27 NOTE — Patient Instructions (Signed)

## 2021-04-17 ENCOUNTER — Ambulatory Visit (INDEPENDENT_AMBULATORY_CARE_PROVIDER_SITE_OTHER): Payer: Medicaid Other | Admitting: Obstetrics & Gynecology

## 2021-04-17 ENCOUNTER — Other Ambulatory Visit: Payer: Self-pay

## 2021-04-17 VITALS — BP 105/65 | HR 73 | Wt 120.2 lb

## 2021-04-17 DIAGNOSIS — Z23 Encounter for immunization: Secondary | ICD-10-CM | POA: Diagnosis not present

## 2021-04-17 DIAGNOSIS — Z3492 Encounter for supervision of normal pregnancy, unspecified, second trimester: Secondary | ICD-10-CM

## 2021-04-17 NOTE — Patient Instructions (Signed)

## 2021-04-17 NOTE — Progress Notes (Signed)
   PRENATAL VISIT NOTE  Subjective:  Melissa [name] is a 20 y.o. G1P0 at [redacted]w[redacted]d being seen today for ongoing prenatal care.  She is currently monitored for the following issues for this low-risk pregnancy and has Supervision of low-risk pregnancy on their problem list.  Patient reports no complaints.  Contractions: Not present. Vag. Bleeding: None.  Movement: Present. Denies leaking of fluid.   The following portions of the patient's history were reviewed and updated as appropriate: allergies, current medications, past family history, past medical history, past social history, past surgical history and problem list.   Objective:   Vitals:   04/17/21 0846  BP: 105/65  Pulse: 73  Weight: 120 lb 3.2 oz (54.5 kg)    Fetal Status: Fetal Heart Rate (bpm): 145   Movement: Present     General:  Alert, oriented and cooperative. Patient is in no acute distress.  Skin: Skin is warm and dry. No rash noted.   Cardiovascular: Normal heart rate noted  Respiratory: Normal respiratory effort, no problems with respiration noted  Abdomen: Soft, gravid, appropriate for gestational age.  Pain/Pressure: Absent     Pelvic: Cervical exam deferred        Extremities: Normal range of motion.  Edema: None  Mental Status: Normal mood and affect. Normal behavior. Normal judgment and thought content.   Assessment and Plan:  Pregnancy: G1P0 at [redacted]w[redacted]d 1. Encounter for supervision of low-risk pregnancy in second trimester Routine testing - Glucose Tolerance, 2 Hours w/1 Hour  Preterm labor symptoms and general obstetric precautions including but not limited to vaginal bleeding, contractions, leaking of fluid and fetal movement were reviewed in detail with the patient. Please refer to After Visit Summary for other counseling recommendations.   Return in about 3 weeks (around 05/08/2021).  Future Appointments  Date Time Provider Department Center  05/19/2021 10:45 AM [facility] NURSE [facility]  05/19/2021 11:00 AM  [facility] US1 [facility]    Scheryl Darter, MD

## 2021-04-18 LAB — GLUCOSE TOLERANCE, 2 HOURS W/ 1HR
Glucose, 1 hour: 90 mg/dL (ref 65–179)
Glucose, 2 hour: 81 mg/dL (ref 65–152)
Glucose, Fasting: 71 mg/dL (ref 65–91)

## 2021-05-08 ENCOUNTER — Encounter: Payer: Self-pay | Admitting: Family Medicine

## 2021-05-08 ENCOUNTER — Ambulatory Visit (INDEPENDENT_AMBULATORY_CARE_PROVIDER_SITE_OTHER): Payer: Medicaid Other | Admitting: Family Medicine

## 2021-05-08 ENCOUNTER — Other Ambulatory Visit: Payer: Self-pay

## 2021-05-08 VITALS — BP 107/66 | HR 87 | Wt 119.0 lb

## 2021-05-08 DIAGNOSIS — Z3493 Encounter for supervision of normal pregnancy, unspecified, third trimester: Secondary | ICD-10-CM

## 2021-05-08 NOTE — Progress Notes (Signed)
   PRENATAL VISIT NOTE  Subjective:  Melissa [name] is a 20 y.o. G1P0 at [redacted]w[redacted]d being seen today for ongoing prenatal care.  She is currently monitored for the following issues for this low-risk pregnancy and has Supervision of low-risk pregnancy on their problem list.  Patient reports no complaints.  Contractions: Not present. Vag. Bleeding: None.  Movement: Present. Denies leaking of fluid.   The following portions of the patient's history were reviewed and updated as appropriate: allergies, current medications, past family history, past medical history, past social history, past surgical history and problem list.   Objective:   Vitals:   05/08/21 1046  BP: 107/66  Pulse: 87  Weight: 119 lb (54 kg)    Fetal Status: Fetal Heart Rate (bpm): 140 Fundal Height: 30 cm Movement: Present     General:  Alert, oriented and cooperative. Patient is in no acute distress.  Skin: Skin is warm and dry. No rash noted.   Cardiovascular: Normal heart rate noted  Respiratory: Normal respiratory effort, no problems with respiration noted  Abdomen: Soft, gravid, appropriate for gestational age.  Pain/Pressure: Absent     Pelvic: Cervical exam deferred        Extremities: Normal range of motion.  Edema: None  Mental Status: Normal mood and affect. Normal behavior. Normal judgment and thought content.   Assessment and Plan:  Pregnancy: G1P0 at [redacted]w[redacted]d 1. Encounter for supervision of low-risk pregnancy in third trimester Continue routine prenatal care.   Preterm labor symptoms and general obstetric precautions including but not limited to vaginal bleeding, contractions, leaking of fluid and fetal movement were reviewed in detail with the patient. Please refer to After Visit Summary for other counseling recommendations.   Return in 2 weeks (on 05/22/2021) for [facility].  Future Appointments  Date Time Provider Department Center  05/19/2021 10:45 AM [facility] NURSE [facility]  05/19/2021 11:00 AM [facility] US1  [facility]  05/25/2021  2:35 PM Burleson, Brand Males, NP [facility]    Reva Bores, MD

## 2021-05-08 NOTE — Patient Instructions (Signed)

## 2021-05-19 ENCOUNTER — Other Ambulatory Visit: Payer: Self-pay

## 2021-05-19 ENCOUNTER — Ambulatory Visit: Payer: Medicaid Other | Admitting: *Deleted

## 2021-05-19 ENCOUNTER — Ambulatory Visit: Payer: Medicaid Other | Attending: Obstetrics and Gynecology

## 2021-05-19 ENCOUNTER — Encounter: Payer: Self-pay | Admitting: *Deleted

## 2021-05-19 VITALS — BP 113/70 | HR 88

## 2021-05-19 DIAGNOSIS — Z362 Encounter for other antenatal screening follow-up: Secondary | ICD-10-CM | POA: Diagnosis not present

## 2021-05-19 DIAGNOSIS — Z3A31 31 weeks gestation of pregnancy: Secondary | ICD-10-CM

## 2021-05-19 DIAGNOSIS — Z3403 Encounter for supervision of normal first pregnancy, third trimester: Secondary | ICD-10-CM | POA: Diagnosis present

## 2021-05-19 DIAGNOSIS — Z3400 Encounter for supervision of normal first pregnancy, unspecified trimester: Secondary | ICD-10-CM

## 2021-05-19 DIAGNOSIS — O283 Abnormal ultrasonic finding on antenatal screening of mother: Secondary | ICD-10-CM

## 2021-05-25 ENCOUNTER — Other Ambulatory Visit: Payer: Self-pay | Admitting: Nurse Practitioner

## 2021-05-25 ENCOUNTER — Other Ambulatory Visit: Payer: Self-pay

## 2021-05-25 ENCOUNTER — Ambulatory Visit (INDEPENDENT_AMBULATORY_CARE_PROVIDER_SITE_OTHER): Payer: Medicaid Other | Admitting: Nurse Practitioner

## 2021-05-25 VITALS — BP 105/67 | HR 99 | Wt 118.3 lb

## 2021-05-25 DIAGNOSIS — O26843 Uterine size-date discrepancy, third trimester: Secondary | ICD-10-CM

## 2021-05-25 DIAGNOSIS — O2613 Low weight gain in pregnancy, third trimester: Secondary | ICD-10-CM

## 2021-05-25 DIAGNOSIS — Z3493 Encounter for supervision of normal pregnancy, unspecified, third trimester: Secondary | ICD-10-CM

## 2021-05-25 MED ORDER — DOXYLAMINE-PYRIDOXINE 10-10 MG PO TBEC: DELAYED_RELEASE_TABLET | ORAL | 2 refills | Status: DC

## 2021-05-25 MED ORDER — ENSURE ENLIVE PO LIQD: 1.0000 | Freq: Two times a day (BID) | ORAL | Status: DC

## 2021-05-25 MED ORDER — ENSURE COMPLETE SHAKE PO LIQD: ORAL | 5 refills | Status: DC

## 2021-05-25 NOTE — Progress Notes (Signed)
Patient stated that she is doing well and have no questions or concerns   Melissa [name] CMA  05/25/21

## 2021-05-25 NOTE — Progress Notes (Signed)
    Subjective:  Melissa [name] is a 20 y.o. G1P0 at [redacted]w[redacted]d being seen today for ongoing prenatal care.  She is currently monitored for the following issues for this low-risk pregnancy and has Supervision of low-risk pregnancy on their problem list.  Patient reports no complaints.  Contractions: Not present. Vag. Bleeding: None.  Movement: Present. Denies leaking of fluid.   The following portions of the patient's history were reviewed and updated as appropriate: allergies, current medications, past family history, past medical history, past social history, past surgical history and problem list. Problem list updated.  Objective:   Vitals:   05/25/21 1441  BP: 105/67  Pulse: 99  Weight: 118 lb 4.8 oz (53.7 kg)    Fetal Status: Fetal Heart Rate (bpm): 133   Movement: Present     General:  Alert, oriented and cooperative. Patient is in no acute distress.  Skin: Skin is warm and dry. No rash noted.   Cardiovascular: Normal heart rate noted  Respiratory: Normal respiratory effort, no problems with respiration noted  Abdomen: Soft, gravid, appropriate for gestational age. Pain/Pressure: Absent     Pelvic:  Cervical exam deferred        Extremities: Normal range of motion.  Edema: None  Mental Status: Normal mood and affect. Normal behavior. Normal judgment and thought content.   Urinalysis:      Assessment and Plan:  Pregnancy: G1P0 at [redacted]w[redacted]d  1. Encounter for supervision of low-risk pregnancy in third trimester Feeling the baby move well Only had glucola testing for 3rd trimester - did not get all her labs - will get the others today Reviewed calling potential pediatric provider today  - CBC - RPR - HIV Antibody (routine testing w rflx)  2. Low weight gain in pregnancy in third trimester Not eating well - not enough protein and not frequently enough States she has no appetite and gets nauseated Will try Diclegis - prescribed to her pharmacy Prescribed Ensure to her pharmacy  and printed precription to [facility] wiith [facility] on Thursday  3. Uterine size-date discrepancy in third trimester Had [place] 05-19-21 and baby at 32% Another [place] is not needed as baby is appropriate size although fundal heigh is lagging  Preterm labor symptoms and general obstetric precautions including but not limited to vaginal bleeding, contractions, leaking of fluid and fetal movement were reviewed in detail with the patient. Please refer to After Visit Summary for other counseling recommendations.  Return in about 2 weeks (around 06/08/2021) for in person ROB.  Nolene Bernheim, RN, MSN, NP-BC Nurse Practitioner, [facility] for [employer], [facility] 05/25/2021 3:09 PM

## 2021-05-26 LAB — CBC
Hematocrit: 32.6 % — ABNORMAL LOW (ref 34.0–46.6)
Hemoglobin: 10.7 g/dL — ABNORMAL LOW (ref 11.1–15.9)
MCH: 26.1 pg — ABNORMAL LOW (ref 26.6–33.0)
MCHC: 32.8 g/dL (ref 31.5–35.7)
MCV: 80 fL (ref 79–97)
Platelets: 223 10*3/uL (ref 150–450)
RBC: 4.1 x10E6/uL (ref 3.77–5.28)
RDW: 12.4 % (ref 11.7–15.4)
WBC: 6.2 10*3/uL (ref 3.4–10.8)

## 2021-05-26 LAB — HIV ANTIBODY (ROUTINE TESTING W REFLEX): HIV Screen 4th Generation wRfx: NONREACTIVE

## 2021-05-26 LAB — RPR: RPR Ser Ql: NONREACTIVE

## 2021-06-10 ENCOUNTER — Other Ambulatory Visit: Payer: Self-pay

## 2021-06-10 ENCOUNTER — Ambulatory Visit (INDEPENDENT_AMBULATORY_CARE_PROVIDER_SITE_OTHER): Payer: Medicaid Other | Admitting: Certified Nurse Midwife

## 2021-06-10 ENCOUNTER — Encounter: Payer: Self-pay | Admitting: Certified Nurse Midwife

## 2021-06-10 VITALS — BP 112/82 | HR 90 | Wt 122.0 lb

## 2021-06-10 DIAGNOSIS — O26843 Uterine size-date discrepancy, third trimester: Secondary | ICD-10-CM

## 2021-06-10 DIAGNOSIS — Z3A34 34 weeks gestation of pregnancy: Secondary | ICD-10-CM

## 2021-06-10 DIAGNOSIS — Z3403 Encounter for supervision of normal first pregnancy, third trimester: Secondary | ICD-10-CM

## 2021-06-10 NOTE — Progress Notes (Addendum)
   PRENATAL VISIT NOTE  Subjective:  Melissa [name] is a 20 y.o. G1P0 at [redacted]w[redacted]d being seen today for ongoing prenatal care.  She is currently monitored for the following issues for this low-risk pregnancy and has Supervision of low-risk pregnancy on their problem list.  Patient reports no complaints. Pt is excited for baby shower on 7/13.   Contractions: Irritability. Vag. Bleeding: None.  Movement: Present. Denies leaking of fluid.   The following portions of the patient's history were reviewed and updated as appropriate: allergies, current medications, past family history, past medical history, past social history, past surgical history and problem list.   Objective:   Vitals:   06/10/21 1021  BP: 112/82  Pulse: 90  Weight: 55.3 kg    Fetal Status: Fetal Heart Rate (bpm): 138 Fundal Height: 32 cm Movement: Present     General:  Alert, oriented and cooperative. Patient is in no acute distress.  Skin: Skin is warm and dry. No rash noted.   Cardiovascular: Normal heart rate noted  Respiratory: Normal respiratory effort, no problems with respiration noted  Abdomen: Soft, gravid, appropriate for gestational age.  Pain/Pressure: Present     Pelvic: Cervical exam deferred        Extremities: Normal range of motion.  Edema: None  Mental Status: Normal mood and affect. Normal behavior. Normal judgment and thought content.   Assessment and Plan:  Pregnancy: G1P0 at [redacted]w[redacted]d 1. Supervision of low-risk first pregnancy, third trimester - Zakariah is doing well, having frequent active fetal movement.  - Expecting baby boy "Zyier"   - Anticipatory guidance provided for what to expect during labor.   2. [redacted] weeks gestation of pregnancy - GBS and GC/Chlamydia next visit.  - Education provided on GBS prophylaxis in labor if needed. Pt agreeable to plan.    3. Uterine size-date discrepancy in third trimester - S<D with lagging fundal height. [place] at 31weeks with EFW 32%tile. Constitutionally  small infant. Watch trend of fundal height.   Preterm labor symptoms and general obstetric precautions including but not limited to vaginal bleeding, contractions, leaking of fluid and fetal movement were reviewed in detail with the patient. Please refer to After Visit Summary for other counseling recommendations.   Return in about 2 weeks (around 06/24/2021) for LROB w GBS, IN-PERSON.  Future Appointments  Date Time Provider Department Center  06/25/2021  8:55 AM Constant, Gigi Gin, MD [facility]    Signed:  Khalib Fendley Danella Deis) Suzie Portela, BSN, RNC-OB  Student Nurse-Midwife   06/11/2021  8:46 AM

## 2021-06-22 ENCOUNTER — Emergency Department (HOSPITAL_COMMUNITY)
Admission: EM | Admit: 2021-06-22 | Discharge: 2021-06-22 | Disposition: A | Discharge status: Home / Self Care | Payer: Medicaid Other | Attending: Emergency Medicine | Admitting: Emergency Medicine

## 2021-06-22 ENCOUNTER — Encounter (HOSPITAL_COMMUNITY): Payer: Self-pay

## 2021-06-22 ENCOUNTER — Other Ambulatory Visit: Payer: Self-pay

## 2021-06-22 DIAGNOSIS — Z5321 Procedure and treatment not carried out due to patient leaving prior to being seen by health care provider: Secondary | ICD-10-CM | POA: Diagnosis not present

## 2021-06-22 DIAGNOSIS — U071 COVID-19: Secondary | ICD-10-CM | POA: Diagnosis present

## 2021-06-22 NOTE — ED Triage Notes (Signed)
Pt states that she took an at home covid test 30 minutes ago and was positive. Pt wants a PCR done to be sure. Pt complains of body aches.

## 2021-06-25 ENCOUNTER — Encounter: Payer: Self-pay | Admitting: Obstetrics and Gynecology

## 2021-06-25 ENCOUNTER — Telehealth (INDEPENDENT_AMBULATORY_CARE_PROVIDER_SITE_OTHER): Payer: Medicaid Other | Admitting: Obstetrics and Gynecology

## 2021-06-25 VITALS — BP 116/72 | HR 98

## 2021-06-25 DIAGNOSIS — Z3A36 36 weeks gestation of pregnancy: Secondary | ICD-10-CM

## 2021-06-25 DIAGNOSIS — Z3493 Encounter for supervision of normal pregnancy, unspecified, third trimester: Secondary | ICD-10-CM

## 2021-06-25 NOTE — Progress Notes (Signed)
   OBSTETRICS PRENATAL VIRTUAL VISIT ENCOUNTER NOTE  Provider location: Center for [facility]   Patient location: Home  I connected with Melissa [name] on 06/25/21 at  8:55 AM EDT by MyChart Video Encounter and verified that I am speaking with the correct person using two identifiers. I discussed the limitations, risks, security and privacy concerns of performing an evaluation and management service virtually and the availability of in person appointments. I also discussed with the patient that there may be a patient responsible charge related to this service. The patient expressed understanding and agreed to proceed. Subjective:  Melissa [name] is a 20 y.o. G1P0 at [redacted]w[redacted]d being seen today for ongoing prenatal care.  She is currently monitored for the following issues for this low-risk pregnancy and has Supervision of low-risk pregnancy on their problem list.  Patient reports persistent cough associated with recent covid infection.  Contractions: Irritability. Vag. Bleeding: None.  Movement: Present. Denies any leaking of fluid.   The following portions of the patient's history were reviewed and updated as appropriate: allergies, current medications, past family history, past medical history, past social history, past surgical history and problem list.   Objective:   Vitals:   06/25/21 0854  BP: 116/72  Pulse: 98    Fetal Status:     Movement: Present     General:  Alert, oriented and cooperative. Patient is in no acute distress.  Respiratory: Normal respiratory effort, no problems with respiration noted  Mental Status: Normal mood and affect. Normal behavior. Normal judgment and thought content.  Rest of physical exam deferred due to type of encounter  Imaging: No results found.  Assessment and Plan:  Pregnancy: G1P0 at [redacted]w[redacted]d 1. Encounter for supervision of low-risk pregnancy in third trimester Patient is doing well without complaints Supportive  care measures discussed Cultures next visit Patient is planning birth control pills for contraception  Preterm labor symptoms and general obstetric precautions including but not limited to vaginal bleeding, contractions, leaking of fluid and fetal movement were reviewed in detail with the patient. I discussed the assessment and treatment plan with the patient. The patient was provided an opportunity to ask questions and all were answered. The patient agreed with the plan and demonstrated an understanding of the instructions. The patient was advised to call back or seek an in-person office evaluation/go to [facility] at [facility] for any urgent or concerning symptoms. Please refer to After Visit Summary for other counseling recommendations.   I provided 15 minutes of face-to-face time during this encounter.  Return in about 1 week (around 07/02/2021) for in person, ROB, Low risk.  No future appointments.  Catalina Antigua, MD Center for [employer], [facility]

## 2021-07-08 ENCOUNTER — Other Ambulatory Visit (HOSPITAL_COMMUNITY)
Admission: RE | Admit: 2021-07-08 | Discharge: 2021-07-08 | Disposition: A | Discharge status: Home / Self Care | Payer: Medicaid Other | Source: Ambulatory Visit | Attending: Family Medicine | Admitting: Family Medicine

## 2021-07-08 ENCOUNTER — Other Ambulatory Visit: Payer: Self-pay

## 2021-07-08 ENCOUNTER — Ambulatory Visit (INDEPENDENT_AMBULATORY_CARE_PROVIDER_SITE_OTHER): Payer: Medicaid Other | Admitting: Family Medicine

## 2021-07-08 VITALS — BP 103/67 | HR 90 | Wt 122.4 lb

## 2021-07-08 DIAGNOSIS — Z3493 Encounter for supervision of normal pregnancy, unspecified, third trimester: Secondary | ICD-10-CM | POA: Diagnosis present

## 2021-07-08 NOTE — Progress Notes (Signed)
   Subjective:  Melissa [name] is a 20 y.o. G1P0 at [redacted]w[redacted]d being seen today for ongoing prenatal care.  She is currently monitored for the following issues for this low-risk pregnancy and has Supervision of low-risk pregnancy on their problem list.  Patient reports no complaints.  Contractions: Irritability. Vag. Bleeding: None.  Movement: Present. Denies leaking of fluid.   The following portions of the patient's history were reviewed and updated as appropriate: allergies, current medications, past family history, past medical history, past social history, past surgical history and problem list. Problem list updated.  Objective:   Vitals:   07/08/21 1003  BP: 103/67  Pulse: 90  Weight: 122 lb 6.4 oz (55.5 kg)    Fetal Status: Fetal Heart Rate (bpm): 143   Movement: Present     General:  Alert, oriented and cooperative. Patient is in no acute distress.  Skin: Skin is warm and dry. No rash noted.   Cardiovascular: Normal heart rate noted  Respiratory: Normal respiratory effort, no problems with respiration noted  Abdomen: Soft, gravid, appropriate for gestational age. Pain/Pressure: Present     Pelvic: Vag. Bleeding: None     Cervical exam deferred        Extremities: Normal range of motion.  Edema: None  Mental Status: Normal mood and affect. Normal behavior. Normal judgment and thought content.   Urinalysis:      Assessment and Plan:  Pregnancy: G1P0 at [redacted]w[redacted]d  1. Encounter for supervision of low-risk pregnancy in third trimester BP and FHR normal Swabs collected today Discussed contraception at length, planning on POPs but encouraged to consider a LARC, referred to bedsider  Term labor symptoms and general obstetric precautions including but not limited to vaginal bleeding, contractions, leaking of fluid and fetal movement were reviewed in detail with the patient. Please refer to After Visit Summary for other counseling recommendations.  Return in 1 week (on 07/15/2021) for ob  visit, [facility].   Venora Maples, MD

## 2021-07-08 NOTE — Patient Instructions (Signed)

## 2021-07-09 LAB — GC/CHLAMYDIA PROBE AMP (~~LOC~~) NOT AT ARMC
Chlamydia: NEGATIVE
Comment: NEGATIVE
Comment: NORMAL
Neisseria Gonorrhea: NEGATIVE

## 2021-07-11 LAB — CULTURE, BETA STREP (GROUP B ONLY): Strep Gp B Culture: POSITIVE — AB

## 2021-07-12 ENCOUNTER — Encounter: Payer: Self-pay | Admitting: Family Medicine

## 2021-07-12 DIAGNOSIS — O99820 Streptococcus B carrier state complicating pregnancy: Secondary | ICD-10-CM | POA: Insufficient documentation

## 2021-07-15 ENCOUNTER — Encounter (HOSPITAL_COMMUNITY): Payer: Self-pay | Admitting: Obstetrics & Gynecology

## 2021-07-15 ENCOUNTER — Other Ambulatory Visit: Payer: Self-pay

## 2021-07-15 ENCOUNTER — Inpatient Hospital Stay (HOSPITAL_COMMUNITY): Payer: Medicaid Other | Admitting: Anesthesiology

## 2021-07-15 ENCOUNTER — Inpatient Hospital Stay (HOSPITAL_COMMUNITY)
Admission: AD | Admit: 2021-07-15 | Discharge: 2021-07-18 | DRG: 807 | Disposition: A | Discharge status: Home / Self Care | Payer: Medicaid Other | Attending: Obstetrics and Gynecology | Admitting: Obstetrics and Gynecology

## 2021-07-15 ENCOUNTER — Ambulatory Visit (INDEPENDENT_AMBULATORY_CARE_PROVIDER_SITE_OTHER): Payer: Medicaid Other | Admitting: Nurse Practitioner

## 2021-07-15 VITALS — BP 107/72 | HR 89 | Wt 122.7 lb

## 2021-07-15 DIAGNOSIS — O99824 Streptococcus B carrier state complicating childbirth: Secondary | ICD-10-CM | POA: Diagnosis present

## 2021-07-15 DIAGNOSIS — Z3493 Encounter for supervision of normal pregnancy, unspecified, third trimester: Secondary | ICD-10-CM

## 2021-07-15 DIAGNOSIS — Z8616 Personal history of COVID-19: Secondary | ICD-10-CM | POA: Diagnosis not present

## 2021-07-15 DIAGNOSIS — Z3A39 39 weeks gestation of pregnancy: Secondary | ICD-10-CM | POA: Diagnosis not present

## 2021-07-15 DIAGNOSIS — U071 COVID-19: Secondary | ICD-10-CM | POA: Diagnosis not present

## 2021-07-15 DIAGNOSIS — O9852 Other viral diseases complicating childbirth: Secondary | ICD-10-CM | POA: Diagnosis not present

## 2021-07-15 LAB — TYPE AND SCREEN
ABO/RH(D): A POS
Antibody Screen: NEGATIVE

## 2021-07-15 LAB — CBC
HCT: 36.5 % (ref 36.0–46.0)
Hemoglobin: 11.7 g/dL — ABNORMAL LOW (ref 12.0–15.0)
MCH: 25.2 pg — ABNORMAL LOW (ref 26.0–34.0)
MCHC: 32.1 g/dL (ref 30.0–36.0)
MCV: 78.7 fL — ABNORMAL LOW (ref 80.0–100.0)
Platelets: 246 10*3/uL (ref 150–400)
RBC: 4.64 MIL/uL (ref 3.87–5.11)
RDW: 14.6 % (ref 11.5–15.5)
WBC: 10.5 10*3/uL (ref 4.0–10.5)
nRBC: 0 % (ref 0.0–0.2)

## 2021-07-15 LAB — RESP PANEL BY RT-PCR (FLU A&B, COVID) ARPGX2
Influenza A by PCR: NEGATIVE
Influenza B by PCR: NEGATIVE
SARS Coronavirus 2 by RT PCR: POSITIVE — AB

## 2021-07-15 MED ORDER — PHENYLEPHRINE 40 MCG/ML (10ML) SYRINGE FOR IV PUSH (FOR BLOOD PRESSURE SUPPORT): 80.0000 ug | PREFILLED_SYRINGE | INTRAVENOUS | Status: DC | PRN

## 2021-07-15 MED ORDER — OXYCODONE-ACETAMINOPHEN 5-325 MG PO TABS: 2.0000 | ORAL_TABLET | ORAL | Status: DC | PRN

## 2021-07-15 MED ORDER — LACTATED RINGERS IV SOLN: 500.0000 mL | INTRAVENOUS | Status: DC | PRN

## 2021-07-15 MED ORDER — OXYCODONE-ACETAMINOPHEN 5-325 MG PO TABS: 1.0000 | ORAL_TABLET | ORAL | Status: DC | PRN

## 2021-07-15 MED ORDER — ACETAMINOPHEN 325 MG PO TABS: 650.0000 mg | ORAL_TABLET | ORAL | Status: DC | PRN

## 2021-07-15 MED ORDER — EPHEDRINE 5 MG/ML INJ: 10.0000 mg | INTRAVENOUS | Status: DC | PRN

## 2021-07-15 MED ORDER — FENTANYL-BUPIVACAINE-NACL 0.5-0.125-0.9 MG/250ML-% EP SOLN
EPIDURAL | Status: DC | PRN
  Administered 2021-07-15: 12 mL/h via EPIDURAL

## 2021-07-15 MED ORDER — SOD CITRATE-CITRIC ACID 500-334 MG/5ML PO SOLN: 30.0000 mL | ORAL | Status: DC | PRN

## 2021-07-15 MED ORDER — DIPHENHYDRAMINE HCL 50 MG/ML IJ SOLN
12.5000 mg | INTRAMUSCULAR | Status: DC | PRN
Start: 2021-07-15 — End: 2021-07-16

## 2021-07-15 MED ORDER — FENTANYL-BUPIVACAINE-NACL 0.5-0.125-0.9 MG/250ML-% EP SOLN
12.0000 mL/h | EPIDURAL | Status: DC | PRN
  Filled 2021-07-15: qty 250

## 2021-07-15 MED ORDER — PENICILLIN G POT IN DEXTROSE 60000 UNIT/ML IV SOLN
3.0000 10*6.[IU] | INTRAVENOUS | Status: DC
Start: 2021-07-15 — End: 2021-07-16
  Administered 2021-07-15: 3 10*6.[IU] via INTRAVENOUS
  Filled 2021-07-15: qty 50

## 2021-07-15 MED ORDER — LACTATED RINGERS IV SOLN: 500.0000 mL | Freq: Once | INTRAVENOUS | Status: DC

## 2021-07-15 MED ORDER — LACTATED RINGERS IV SOLN: INTRAVENOUS | Status: DC

## 2021-07-15 MED ORDER — SODIUM CHLORIDE 0.9 % IV SOLN
5.0000 10*6.[IU] | Freq: Once | INTRAVENOUS | Status: AC
  Administered 2021-07-15: 5 10*6.[IU] via INTRAVENOUS
  Filled 2021-07-15: qty 5

## 2021-07-15 MED ORDER — ONDANSETRON HCL 4 MG/2ML IJ SOLN
4.0000 mg | Freq: Four times a day (QID) | INTRAMUSCULAR | Status: DC | PRN
  Filled 2021-07-15: qty 2

## 2021-07-15 MED ORDER — FENTANYL CITRATE (PF) 100 MCG/2ML IJ SOLN
50.0000 ug | INTRAMUSCULAR | Status: DC | PRN
  Filled 2021-07-15: qty 2

## 2021-07-15 MED ORDER — LIDOCAINE HCL (PF) 1 % IJ SOLN
INTRAMUSCULAR | Status: DC | PRN
  Administered 2021-07-15: 5 mL via EPIDURAL

## 2021-07-15 MED ORDER — LIDOCAINE HCL (PF) 1 % IJ SOLN: 30.0000 mL | INTRAMUSCULAR | Status: DC | PRN

## 2021-07-15 NOTE — Progress Notes (Signed)
Labor Progress Note Melissa [name] is a 20 y.o. G1P0 at [redacted]w[redacted]d presented for early labor/IOL for non-reactive NST.   S: Feeling much better after the epidural, not feeling contractions anymore.   O:  BP 113/69   Pulse 79   Temp 98.1 F (36.7 C) (Oral)   Resp 18   Ht 5\' 1"  (1.549 m)   Wt 55.7 kg   LMP 10/11/2020 (Exact Date)   SpO2 99%   BMI 23.18 kg/m  EFM: 140/mod/+15x15, no decels Ctx every 2-5 min   CVE: Dilation: 4 Effacement (%): 90 Cervical Position: Anterior Station: -2 Presentation: Vertex Exam by:: Dr [email or web address]   A&P: 20 y.o. G1P0 [redacted]w[redacted]d  #Labor: Some progression since last check. Appropriate contraction pattern, discussed expectant management vs pit--with patient opted to monitor for 2 hours and then if minimal change, start pit 2x2  #Pain: Epidural  #FWB: Cat 1 currently  #GBS positive PCN   #COVID positive: possible continued positive from recent infection at the end of July. Will continue precautions however.    August, DO 9:42 PM

## 2021-07-15 NOTE — Anesthesia Preprocedure Evaluation (Signed)
Anesthesia Evaluation  Patient identified by MRN, date of birth, ID band Patient awake    Reviewed: Allergy & Precautions, NPO status , Patient's Chart, lab work & pertinent test results  Airway Mallampati: II  TM Distance: >3 FB Neck ROM: Full    Dental no notable dental hx. (+) Teeth Intact, Dental Advisory Given   Pulmonary  Covid +   Pulmonary exam normal breath sounds clear to auscultation       Cardiovascular Exercise Tolerance: Good Normal cardiovascular exam Rhythm:Regular Rate:Normal     Neuro/Psych negative neurological ROS     GI/Hepatic negative GI ROS, Neg liver ROS,   Endo/Other  negative endocrine ROS  Renal/GU negative Renal ROS     Musculoskeletal negative musculoskeletal ROS (+)   Abdominal   Peds  Hematology Lab Results      Component                Value               Date                      WBC                      10.5                07/15/2021                HGB                      11.7 (L)            07/15/2021                HCT                      36.5                07/15/2021                MCV                      78.7 (L)            07/15/2021                PLT                      246                 07/15/2021              Anesthesia Other Findings   Reproductive/Obstetrics (+) Pregnancy                             Anesthesia Physical Anesthesia Plan  ASA: 2  Anesthesia Plan: Epidural   Post-op Pain Management:    Induction:   PONV Risk Score and Plan:   Airway Management Planned:   Additional Equipment:   Intra-op Plan:   Post-operative Plan:   Informed Consent: I have reviewed the patients History and Physical, chart, labs and discussed the procedure including the risks, benefits and alternatives for the proposed anesthesia with the patient or authorized representative who has indicated his/her understanding and acceptance.        Plan Discussed with:   Anesthesia Plan Comments: (39.4 wk covid +  Primagravida for LEA)       Anesthesia Quick Evaluation

## 2021-07-15 NOTE — H&P (Addendum)
OBSTETRIC ADMISSION HISTORY AND PHYSICAL  Melissa [name] is a 20 y.o. female G1P0 with IUP at 38w4dby LMP presenting for IOL due to non-reactive NST in MAU. She reports +FMs, no LOF, no VB, no blurry vision, headaches, peripheral edema, or RUQ pain.  She plans on breast feeding. She requests POPs for birth control.  She received her prenatal care at  [facility] --->  Estimated Date of Delivery: 07/18/21  Sono:   _0 , CWD, normal anatomy, cephalic presentation, placenta posterior lie, 1727g, 32% EFW  Prenatal History/Complications: None - Low risk pregnancy  Past Medical History: Past Medical History:  Diagnosis Date   Medical history non-contributory    Seasonal allergies     Past Surgical History: Past Surgical History:  Procedure Laterality Date   NO PAST SURGERIES      Obstetrical History: OB History     Gravida  1   Para      Term      Preterm      AB      Living         SAB      IAB      Ectopic      Multiple      Live Births              Social History Social History   Socioeconomic History   Marital status: Single    Spouse name: Not on file   Number of children: Not on file   Years of education: Not on file   Highest education level: Not on file  Occupational History   Not on file  Tobacco Use   Smoking status: Never   Smokeless tobacco: Never  Vaping Use   Vaping Use: Never used  Substance and Sexual Activity   Alcohol use: No   Drug use: No   Sexual activity: Yes    Birth control/protection: None  Other Topics Concern   Not on file  Social History Narrative   Not on file   Social Determinants of Health   Financial Resource Strain: Not on file  Food Insecurity: No Food Insecurity   Worried About Running Out of Food in the Last Year: Never true   Ran Out of Food in the Last Year: Never true  Transportation Needs: No Transportation Needs   Lack of Transportation (Medical): No   Lack of Transportation  (Non-Medical): No  Physical Activity: Not on file  Stress: Not on file  Social Connections: Not on file    Family History: History reviewed. No pertinent family history.  Allergies: No Known Allergies  Medications Prior to Admission  Medication Sig Dispense Refill Last Dose   Prenatal Vit-Fe Fumarate-FA (PREPLUS) 27-1 MG TABS Take 1 tablet by mouth daily.   07/14/2021   Blood Pressure Monitoring (BLOOD PRESSURE KIT) DEVI 1 Device by Does not apply route as needed. (Patient not taking: Reported on 07/15/2021) 1 each 0    Doxylamine-Pyridoxine (DICLEGIS) 10-10 MG TBEC Take 2 tablets at bedtime and one in the morning and one in the afternoon as needed for nausea. (Patient not taking: Reported on 07/15/2021) 60 tablet 2    Nutritional Supplements (ENSURE COMPLETE SHAKE) LIQD One bottle between meals twice a day (Patient not taking: Reported on 07/15/2021) 237 mL 5      Review of Systems  All systems reviewed and negative except as stated in HPI  Blood pressure 116/77, pulse 79, temperature 98.1 F (36.7 C), temperature  source Oral, resp. rate 16, height _0  (1.549 m), weight 55.7 kg, last menstrual period 10/11/2020, SpO2 100 %.  General appearance: alert and patient in significant pain from contractions Lungs: normal work on breathing on room air Heart: normal rate  Abdomen: gravid Pelvic: deferred, in significant pain, will reassess after pain management  Presentation: cephalic Fetal monitoring: Baseline: 150 bpm, minimal variability, no accels, no decels Uterine activity: Every 3 minutes  Dilation: 3.5 Effacement (%): 90 Station: 0 Exam by:: Cloretta Ned, RN  Prenatal labs: ABO, Rh: --/--/A POS (08/10 1659) Antibody: NEG (08/10 1659) Rubella: 15.50 (01/28 1115) RPR: Non Reactive (06/20 1516)  HBsAg: Negative (01/28 1115)  HIV: Non Reactive (06/20 1516)  GBS: Positive/-- (08/03 1039)  2 hr Glucola normal Genetic screening  NIPS: Low risk, AFP: neg Anatomy US  normal  Prenatal Transfer Tool  Maternal Diabetes: No Genetic Screening: Normal Maternal Ultrasounds/Referrals: Normal Fetal Ultrasounds or other Referrals:  None Maternal Substance Abuse:  No Significant Maternal Medications:  None Significant Maternal Lab Results: Group B Strep positive  Results for orders placed or performed during the hospital encounter of 07/15/21 (from the past 24 hour(s))  Resp Panel by RT-PCR (Flu A&B, Covid) Nasopharyngeal Swab   Collection Time: 07/15/21  4:52 PM   Specimen: Nasopharyngeal Swab; Nasopharyngeal(NP) swabs in vial transport medium  Result Value Ref Range   SARS Coronavirus 2 by RT PCR POSITIVE (A) NEGATIVE   Influenza A by PCR NEGATIVE NEGATIVE   Influenza B by PCR NEGATIVE NEGATIVE  Type and screen [place]   Collection Time: 07/15/21  4:59 PM  Result Value Ref Range   ABO/RH(D) A POS    Antibody Screen NEG    Sample Expiration      07/18/2021,2359 Performed at [facility], [address], [place], [place] [address]   CBC   Collection Time: 07/15/21  5:19 PM  Result Value Ref Range   WBC 10.5 4.0 - 10.5 K/uL   RBC 4.64 3.87 - 5.11 MIL/uL   Hemoglobin 11.7 (L) 12.0 - 15.0 g/dL   HCT 36.5 36.0 - 46.0 %   MCV 78.7 (L) 80.0 - 100.0 fL   MCH 25.2 (L) 26.0 - 34.0 pg   MCHC 32.1 30.0 - 36.0 g/dL   RDW 14.6 11.5 - 15.5 %   Platelets 246 150 - 400 K/uL   nRBC 0.0 0.0 - 0.2 %    Patient Active Problem List   Diagnosis Date Noted   Indication for care in labor or delivery 07/15/2021   GBS (group B Streptococcus carrier), +RV culture, currently pregnant 07/12/2021   Supervision of low-risk pregnancy 01/02/2021    Assessment/Plan:  Melissa [name] is a 20 y.o. G1P0 at 38w4dhere for IOL due to non-reactive NST.  #Labor: SVE 3.5/90/0 in MAU with bulging bag. Significant pain from contractions every 3 minutes. Will plan for epidural and repeat SVE once patient is more comfortable. Consider starting Pitocin if  minimal change from last check. #Pain: Epidural #FWB: Catefory 2 due to minimal variability; intermittent returns to moderate variability - will continue to monitor #ID: GBS+, penicillin #MOF: Breast feeding #MOC: POPs #Circ: Yes #COVID infection: COVID + on admission. Patient reports positive home test one month ago and was symptomatic. Test likely still positive from recent infection. Continue precautions.   GME ATTESTATION:  I saw and evaluated the patient. I agree with the findings and the plan of care as documented in the resident's note. I have made  changes to documentation as necessary.   Plan to recheck after epidural placement. Will continue to monitor tracing closely.  Vilma Meckel, MD OB Fellow, [place] for [place] 07/15/2021 8:40 PM

## 2021-07-15 NOTE — Progress Notes (Signed)
    Subjective:  Melissa [name] is a 20 y.o. G1P0 at [redacted]w[redacted]d being seen today for ongoing prenatal care.  She is currently monitored for the following issues for this low-risk pregnancy and has Supervision of low-risk pregnancy and GBS (group B Streptococcus carrier), +RV culture, currently pregnant on their problem list.  Patient reports  contractions 10 minutes apart .  Contractions: Not present. Vag. Bleeding: None.  Movement: Present. Denies leaking of fluid.   The following portions of the patient's history were reviewed and updated as appropriate: allergies, current medications, past family history, past medical history, past social history, past surgical history and problem list. Problem list updated.  Objective:   Vitals:   07/15/21 0955  BP: 107/72  Pulse: 89  Weight: 122 lb 11.2 oz (55.7 kg)    Fetal Status: Fetal Heart Rate (bpm): 139   Movement: Present  Presentation: Vertex  General:  Alert, oriented and cooperative. Patient is in no acute distress.  Skin: Skin is warm and dry. No rash noted.   Cardiovascular: Normal heart rate noted  Respiratory: Normal respiratory effort, no problems with respiration noted  Abdomen: Soft, gravid, appropriate for gestational age. Pain/Pressure: Present     Pelvic:  Cervical exam performed Dilation: 3 Effacement (%): 80 Station: -2  Extremities: Normal range of motion.  Edema: None  Mental Status: Normal mood and affect. Normal behavior. Normal judgment and thought content.   Urinalysis:      Assessment and Plan:  Pregnancy: G1P0 at [redacted]w[redacted]d  1. Encounter for supervision of low-risk pregnancy in third trimester Having contractions about 10 minutes apart since 2 am Was awakened with contractions in the early AM Advised contractions will need to be closer to 5 minutes apart for cervix to have steady change. Anticipate she is in early labor and unless contractions stop, anticipate her to be going to the hospital later today when  contractions are closer.  2. [redacted] weeks gestation of pregnancy   Term labor symptoms and general obstetric precautions including but not limited to vaginal bleeding, contractions, leaking of fluid and fetal movement were reviewed in detail with the patient. Please refer to After Visit Summary for other counseling recommendations.  Return in about 1 week (around 07/22/2021) for in person ROB.  Nolene Bernheim, RN, MSN, NP-BC Nurse Practitioner, [facility] for [employer], [facility] 07/15/2021 11:17 AM

## 2021-07-15 NOTE — Progress Notes (Signed)
Patient stated that she started having intense contractions around 2 am today. Contractions lasts for 1 minute and would be 10 minute apart . Denies any vaginal discharge or bleeding.

## 2021-07-15 NOTE — Anesthesia Procedure Notes (Addendum)
Epidural Patient location during procedure: OB Start time: 07/15/2021 8:46 PM End time: 07/15/2021 9:04 PM  Staffing Anesthesiologist: Trevor Iha, MD Performed: anesthesiologist   Preanesthetic Checklist Completed: patient identified, IV checked, site marked, risks and benefits discussed, surgical consent, monitors and equipment checked, pre-op evaluation and timeout performed  Epidural Patient position: sitting Prep: DuraPrep and site prepped and draped Patient monitoring: continuous pulse ox and blood pressure Approach: midline Location: L3-L4 Injection technique: LOR air  Needle:  Needle type: Tuohy  Needle gauge: 17 G Needle length: 9 cm and 9 Needle insertion depth: 5 cm cm Catheter type: closed end flexible Catheter size: 19 Gauge Catheter at skin depth: 11 cm Test dose: negative  Assessment Events: blood not aspirated, injection not painful, no injection resistance, no paresthesia and negative IV test  Additional Notes Patient identified. Risks/Benefits/Options discussed with patient including but not limited to bleeding, infection, nerve damage, paralysis, failed block, incomplete pain control, headache, blood pressure changes, nausea, vomiting, reactions to medication both or allergic, itching and postpartum back pain. Confirmed with bedside nurse the patient's most recent platelet count. Confirmed with patient that they are not currently taking any anticoagulation, have any bleeding history or any family history of bleeding disorders. Patient expressed understanding and wished to proceed. All questions were answered. Sterile technique was used throughout the entire procedure. Please see nursing notes for vital signs. Test dose was given through epidural needle and negative prior to continuing to dose epidural or start infusion. Warning signs of high block given to the patient including shortness of breath, tingling/numbness in hands, complete motor block, or any  concerning symptoms with instructions to call for help. Patient was given instructions on fall risk and not to get out of bed. All questions and concerns addressed with instructions to call with any issues.  1 Attempt (S) . Patient tolerated procedure well.

## 2021-07-15 NOTE — MAU Note (Signed)
Pt reports ctx's since last night. Pt reports 3cm in office today. Pt reports ctx's are 10 minutes apart.   Denies LOF, Denies vaginal bleeding  Reports +fm

## 2021-07-16 ENCOUNTER — Encounter (HOSPITAL_COMMUNITY): Payer: Self-pay | Admitting: Family Medicine

## 2021-07-16 DIAGNOSIS — U071 COVID-19: Secondary | ICD-10-CM

## 2021-07-16 DIAGNOSIS — O9852 Other viral diseases complicating childbirth: Secondary | ICD-10-CM

## 2021-07-16 DIAGNOSIS — O99824 Streptococcus B carrier state complicating childbirth: Secondary | ICD-10-CM

## 2021-07-16 DIAGNOSIS — Z3A39 39 weeks gestation of pregnancy: Secondary | ICD-10-CM

## 2021-07-16 LAB — RPR: RPR Ser Ql: NONREACTIVE

## 2021-07-16 MED ORDER — OXYTOCIN-SODIUM CHLORIDE 30-0.9 UT/500ML-% IV SOLN: 1.0000 m[IU]/min | INTRAVENOUS | Status: DC

## 2021-07-16 MED ORDER — OXYTOCIN BOLUS FROM INFUSION
333.0000 mL | Freq: Once | INTRAVENOUS | Status: AC
  Administered 2021-07-16: 333 mL via INTRAVENOUS

## 2021-07-16 MED ORDER — TERBUTALINE SULFATE 1 MG/ML IJ SOLN: 0.2500 mg | Freq: Once | INTRAMUSCULAR | Status: DC | PRN

## 2021-07-16 MED ORDER — ONDANSETRON HCL 4 MG/2ML IJ SOLN: 4.0000 mg | INTRAMUSCULAR | Status: DC | PRN

## 2021-07-16 MED ORDER — DIBUCAINE (PERIANAL) 1 % EX OINT: 1.0000 "application " | TOPICAL_OINTMENT | CUTANEOUS | Status: DC | PRN

## 2021-07-16 MED ORDER — SIMETHICONE 80 MG PO CHEW: 80.0000 mg | CHEWABLE_TABLET | ORAL | Status: DC | PRN

## 2021-07-16 MED ORDER — IBUPROFEN 600 MG PO TABS
600.0000 mg | ORAL_TABLET | Freq: Four times a day (QID) | ORAL | Status: DC
  Administered 2021-07-16 – 2021-07-18 (×8): 600 mg via ORAL
  Filled 2021-07-16 (×8): qty 1

## 2021-07-16 MED ORDER — ACETAMINOPHEN 325 MG PO TABS: 650.0000 mg | ORAL_TABLET | ORAL | Status: DC | PRN

## 2021-07-16 MED ORDER — COCONUT OIL OIL: 1.0000 "application " | TOPICAL_OIL | Status: DC | PRN

## 2021-07-16 MED ORDER — BENZOCAINE-MENTHOL 20-0.5 % EX AERO: 1.0000 "application " | INHALATION_SPRAY | CUTANEOUS | Status: DC | PRN

## 2021-07-16 MED ORDER — OXYTOCIN-SODIUM CHLORIDE 30-0.9 UT/500ML-% IV SOLN
2.5000 [IU]/h | INTRAVENOUS | Status: DC
  Administered 2021-07-16: 2.5 [IU]/h via INTRAVENOUS
  Filled 2021-07-16: qty 500

## 2021-07-16 MED ORDER — TETANUS-DIPHTH-ACELL PERTUSSIS 5-2.5-18.5 LF-MCG/0.5 IM SUSY: 0.5000 mL | PREFILLED_SYRINGE | Freq: Once | INTRAMUSCULAR | Status: DC

## 2021-07-16 MED ORDER — PRENATAL MULTIVITAMIN CH
1.0000 | ORAL_TABLET | Freq: Every day | ORAL | Status: DC
  Administered 2021-07-16 – 2021-07-18 (×3): 1 via ORAL
  Filled 2021-07-16 (×3): qty 1

## 2021-07-16 MED ORDER — MEASLES, MUMPS & RUBELLA VAC IJ SOLR: 0.5000 mL | Freq: Once | INTRAMUSCULAR | Status: DC

## 2021-07-16 MED ORDER — ZOLPIDEM TARTRATE 5 MG PO TABS: 5.0000 mg | ORAL_TABLET | Freq: Every evening | ORAL | Status: DC | PRN

## 2021-07-16 MED ORDER — SENNOSIDES-DOCUSATE SODIUM 8.6-50 MG PO TABS
2.0000 | ORAL_TABLET | Freq: Every day | ORAL | Status: DC
  Administered 2021-07-17 – 2021-07-18 (×2): 2 via ORAL
  Filled 2021-07-16 (×2): qty 2

## 2021-07-16 MED ORDER — DIPHENHYDRAMINE HCL 25 MG PO CAPS: 25.0000 mg | ORAL_CAPSULE | Freq: Four times a day (QID) | ORAL | Status: DC | PRN

## 2021-07-16 MED ORDER — WITCH HAZEL-GLYCERIN EX PADS: 1.0000 "application " | MEDICATED_PAD | CUTANEOUS | Status: DC | PRN

## 2021-07-16 MED ORDER — ONDANSETRON HCL 4 MG PO TABS: 4.0000 mg | ORAL_TABLET | ORAL | Status: DC | PRN

## 2021-07-16 NOTE — Anesthesia Postprocedure Evaluation (Signed)
Anesthesia Post Note  Patient: Melissa [name]  Procedure(s) Performed: AN AD HOC LABOR EPIDURAL     Patient location during evaluation: Mother Baby Anesthesia Type: Epidural Level of consciousness: awake and alert Pain management: pain level controlled Vital Signs Assessment: post-procedure vital signs reviewed and stable Respiratory status: spontaneous breathing, nonlabored ventilation and respiratory function stable Cardiovascular status: stable Postop Assessment: no headache, no backache and epidural receding Anesthetic complications: no   No notable events documented.  Last Vitals:  Vitals:   07/16/21 0620 07/16/21 0813  BP: 112/75 112/86  Pulse: 72 72  Resp: 18 16  Temp: 37.1 C 36.6 C  SpO2: 99%     Last Pain:  Vitals:   07/16/21 0813  TempSrc: Axillary  PainSc:    Pain Goal:                Epidural/Spinal Function Cutaneous sensation: Normal sensation (07/16/21 0813), Patient able to flex knees: Yes (07/16/21 0813), Patient able to lift hips off bed: Yes (07/16/21 0813), Back pain beyond tenderness at insertion site: No (07/16/21 0813), Progressively worsening motor and/or sensory loss: No (07/16/21 0813), Bowel and/or bladder incontinence post epidural: No (07/16/21 0813)  Marrion Coy

## 2021-07-16 NOTE — Discharge Summary (Signed)
Postpartum Discharge Summary  Date of Service updated    Patient Name: Melissa [name] DOB: [birth year] MRN: [id number]  Date of admission: 07/15/2021 Delivery date:07/16/2021  Delivering provider: Waldon Merl  Date of discharge: 07/18/2021  Admitting diagnosis: Indication for care in labor or delivery [O75.9] Intrauterine pregnancy: [redacted]w[redacted]d     Secondary diagnosis:  Principal Problem:   Vaginal delivery  Additional problems: None    Discharge diagnosis: Term Pregnancy Delivered                                              Post partum procedures: N/A Augmentation: N/A Complications: None  Hospital course: Onset of Labor With Vaginal Delivery      20 y.o. yo G1P0 at [redacted]w[redacted]d was admitted in Latent Labor on 07/15/2021. Patient had an uncomplicated labor course as follows:  Membrane Rupture Time/Date: 12:36 AM ,07/16/2021   Delivery Method:Vaginal, Spontaneous  Episiotomy: None  Lacerations:  1st degree  Patient had an uncomplicated postpartum course.  She is ambulating, tolerating a regular diet, passing flatus, and urinating well. Patient is discharged home in stable condition on 07/18/21.  Newborn Data: Birth date:07/16/2021  Birth time:2:51 AM  Gender:Female  Living status:Living  Apgars:8 ,9  Weight:2860 g   Magnesium Sulfate received: No BMZ received: No Rhophylac:No MMR:No T-DaP:Given prenatally Flu: N/A Transfusion:No  Physical exam  Vitals:   07/17/21 1431 07/17/21 2305 07/18/21 0500 07/18/21 0529  BP: 116/72 112/82  111/69  Pulse: 72 75  62  Resp: $Remo'18 18  18  'QxKBi$ Temp: 98.1 F (36.7 C) 98.2 F (36.8 C)  97.8 F (36.6 C)  TempSrc: Oral Oral  Oral  SpO2:   100% 100%  Weight:      Height:       General: alert, cooperative, and no distress Lochia: appropriate Uterine Fundus: firm DVT Evaluation: no LE edema or calf tenderness  Labs: Lab Results  Component Value Date   WBC 10.5 07/15/2021   HGB 11.7 (L) 07/15/2021   HCT 36.5 07/15/2021   MCV 78.7 (L)  07/15/2021   PLT 246 07/15/2021   CMP Latest Ref Rng & Units 01/29/2016  Glucose 65 - 99 mg/dL 91  BUN 6 - 20 mg/dL 13  Creatinine 0.50 - 1.00 mg/dL 0.61  Sodium 135 - 145 mmol/L 139  Potassium 3.5 - 5.1 mmol/L 3.9  Chloride 101 - 111 mmol/L 107  CO2 22 - 32 mmol/L 24  Calcium 8.9 - 10.3 mg/dL 9.3  Total Protein 6.5 - 8.1 g/dL 7.4  Total Bilirubin 0.3 - 1.2 mg/dL 0.3  Alkaline Phos 50 - 162 U/L 106  AST 15 - 41 U/L 19  ALT 14 - 54 U/L 10(L)   Edinburgh Score: No flowsheet data found.   After visit meds:  Allergies as of 07/18/2021   No Known Allergies      Medication List     STOP taking these medications    Blood Pressure Kit Devi   Doxylamine-Pyridoxine 10-10 MG Tbec Commonly known as: Diclegis   Ensure Complete Shake Liqd       TAKE these medications    acetaminophen 325 MG tablet Commonly known as: Tylenol Take 2 tablets (650 mg total) by mouth every 4 (four) hours as needed (for pain scale < 4).   coconut oil Oil Apply 1 application topically as needed.   ibuprofen 600 MG tablet  Commonly known as: ADVIL Take 1 tablet (600 mg total) by mouth every 6 (six) hours.   norethindrone 0.35 MG tablet Commonly known as: Ortho Micronor Take 1 tablet (0.35 mg total) by mouth daily.   PrePLUS 27-1 MG Tabs Take 1 tablet by mouth daily.   simethicone 80 MG chewable tablet Commonly known as: MYLICON Chew 1 tablet (80 mg total) by mouth as needed for flatulence.         Discharge home in stable condition Infant Feeding: Breast Infant Disposition:home with mother Discharge instruction: per After Visit Summary and Postpartum booklet. Activity: Advance as tolerated. Pelvic rest for 6 weeks.  Diet: routine diet Future Appointments: Future Appointments  Date Time Provider [place]  08/21/2021  8:15 AM Cyndee Brightly, CNM [facility]   Follow up Visit: Message sent to [place] by Dr. Higinio Plan on 8/11.   Please schedule this patient for  a In person postpartum visit in 4 weeks with the following provider: Any provider. Additional Postpartum F/U: None   Low risk pregnancy complicated by:  None  Delivery mode:  Vaginal, Spontaneous  Anticipated Birth Control:  POPs  07/18/2021 Delora Fuel, MD Faculty Practice

## 2021-07-16 NOTE — Progress Notes (Signed)
Per infection prevention, since mom had covid in July, she is OK to come off of precautions.  MD is aware and agrees.

## 2021-07-16 NOTE — Progress Notes (Signed)
Labor Progress Note Melissa [name] is a 20 y.o. G1P0 at [redacted]w[redacted]d presented for early labor/IOL non-reactive NST S:  Feeling very comfortable  O:  BP 118/73   Pulse 70   Temp 98.1 F (36.7 C) (Oral)   Resp 18   Ht 5\' 1"  (1.549 m)   Wt 55.7 kg   LMP 10/11/2020 (Exact Date)   SpO2 100%   BMI 23.18 kg/m  EFM: 135bpm/minimal variability/no accels in 2 hours, 1 small variable after AROM  CVE: Dilation: 7 Effacement (%): 90 Cervical Position: Anterior Station: 0 Presentation: Vertex Exam by:: Dr [email or web address]   A&P: 20 y.o. G1P0 [redacted]w[redacted]d  #Labor: Progressing well. Continue expectant management after AROM this check for clear fluid #Pain: Epidural in place  #FWB: Cat 2 but labor progressing appropriately and periods of moderate variability #GBS positive, PCN #COVID positive: possible continued positive from recent infection at the end of July. Will continue precautions however.    [place], MD 12:41 AM

## 2021-07-16 NOTE — Lactation Note (Addendum)
This note was copied from a baby's chart. Lactation Consultation Note  Patient Name: Melissa [name] Today's Date: 07/16/2021 Reason for consult: Initial assessment;Term;Other (Comment) (+ Covid - precautions taken/)P1 , 1st time breast feeder.  Age:19 hours Per mom last fed at 8 am for 20 mins .  Baby woke up at the consult, small wet. LC offered to assist and mom receptive.  Mom hand expressed drops and baby latched with depth and fed 8 mins with increased swallows/ warm moist heat compress on the breast.  Latch score 8. Per mom comfortable.  LC reviewed breast feeding basics.  LC provided the [facility] brochure with resource numbers after D/C.  Maternal Data Has patient been taught Hand Expression?: Yes Does the patient have breastfeeding experience prior to this delivery?: No  Feeding Mother's Current Feeding Choice: Breast Milk  LATCH Score Latch: Grasps breast easily, tongue down, lips flanged, rhythmical sucking.  Audible Swallowing: A few with stimulation  Type of Nipple: Everted at rest and after stimulation  Comfort (Breast/Nipple): Soft / non-tender  Hold (Positioning): Assistance needed to correctly position infant at breast and maintain latch.  LATCH Score: 8   Lactation Tools Discussed/Used    Interventions Interventions: Breast feeding basics reviewed;Assisted with latch;Skin to skin;Breast massage;Hand express;Breast compression;Adjust position;Support pillows;Education  Discharge Pump:  (will need a hand pump prior to D/C) WIC Program: Yes  Consult Status Consult Status: Follow-up Date: 07/17/21 Follow-up type: In-patient    [name] [name] 07/16/2021, 10:36 AM

## 2021-07-17 MED ORDER — SUCROSE 24% NICU/PEDS ORAL SOLUTION
0.5000 mL | OROMUCOSAL | Status: DC | PRN
Start: 2021-07-17 — End: 2021-07-17

## 2021-07-17 MED ORDER — EPINEPHRINE TOPICAL FOR CIRCUMCISION 0.1 MG/ML: 1.0000 [drp] | TOPICAL | Status: DC | PRN

## 2021-07-17 MED ORDER — LIDOCAINE 1% INJECTION FOR CIRCUMCISION: 0.8000 mL | INJECTION | Freq: Once | INTRAVENOUS | Status: DC

## 2021-07-17 MED ORDER — WHITE PETROLATUM EX OINT: 1.0000 "application " | TOPICAL_OINTMENT | CUTANEOUS | Status: DC | PRN

## 2021-07-17 MED ORDER — ACETAMINOPHEN FOR CIRCUMCISION 160 MG/5 ML: 40.0000 mg | ORAL | Status: DC | PRN

## 2021-07-17 MED ORDER — ACETAMINOPHEN FOR CIRCUMCISION 160 MG/5 ML: 40.0000 mg | Freq: Once | ORAL | Status: DC

## 2021-07-17 NOTE — Lactation Note (Signed)
This note was copied from a baby's chart. Lactation Consultation Note  Patient Name: Melissa [name] Today's Date: 07/17/2021 Reason for consult: Follow-up assessment;Mother's request;Primapara;1st time breastfeeding;Term;Infant weight loss;Other (Comment);Hyperbilirubinemia Age:19 hours  Infant had 1 urine and stool today, bilirubin level rising. LC observed feeding and worked on improving latch and milk transfer.   Infant still hungry after 12 minute feeding. Mom start pumping with dEBP and supplement with EBM after feeding to offer more volume.   Breastfeeding supplementation guide provided with volumes based on hrs of age since delivery given. Dad to pace bottle feed using slow flow nipple after latching. Mom denied any pain with latching or current use of 24 flange.   All questions answered end of the visit.   Maternal Data Has patient been taught Hand Expression?: Yes  Feeding Mother's Current Feeding Choice: Breast Milk  LATCH Score Latch: Repeated attempts needed to sustain latch, nipple held in mouth throughout feeding, stimulation needed to elicit sucking reflex. (Infant at first popping on and off, worked on different positons and with breast compression and increase volume he latched longer)  Audible Swallowing: Spontaneous and intermittent (different positions able to hear increase in audible swallows with breast compression)  Type of Nipple: Everted at rest and after stimulation  Comfort (Breast/Nipple): Soft / non-tender  Hold (Positioning): Assistance needed to correctly position infant at breast and maintain latch.  LATCH Score: 8   Lactation Tools Discussed/Used Tools: Pump;Flanges Flange Size: 24 Breast pump type: Double-Electric Breast Pump Pump Education: Setup, frequency, and cleaning;Milk Storage Reason for Pumping: increase stimulation Pumping frequency: every 3 hrs for 15 in  Interventions Interventions: Breast feeding basics reviewed;Breast  compression;Assisted with latch;Adjust position;Skin to skin;Support pillows;DEBP;Breast massage;Hand express;Expressed milk;Education;Position options  Discharge    Consult Status Consult Status: Follow-up Date: 07/18/21 Follow-up type: In-patient    Melissa Sites  [name] 07/17/2021, 9:47 PM

## 2021-07-17 NOTE — Progress Notes (Signed)
Post Partum Day 1  Subjective: Patient is doing well this AM. No concerns. Up and ambulating, eating and drinking well, bleeding is appropriate. Pain is controlled. Voiding normally.  Objective: Blood pressure 114/75, pulse 70, temperature 98.2 F (36.8 C), temperature source Oral, resp. rate 17, height 5\' 1"  (1.549 m), weight 55.7 kg, last menstrual period 10/11/2020, SpO2 100 %, unknown if currently breastfeeding.  Physical Exam:  General: alert, cooperative, and no distress Lochia: appropriate Uterine Fundus: firm Extremities: no LE edema or calf tenderness  Recent Labs    07/15/21 1719  HGB 11.7*  HCT 36.5    Assessment/Plan: Melissa [name] is a 20 y.o. G1P0 s/p SVD at 100w5d.  Progressing well. Meeting postpartum goals. VSS.   Feeding: Breast Contraception: POPs Circ: Completed today  Dispo: Plan for discharge PPD#2   LOS: 2 days   [redacted]w[redacted]d Obstetrics Fellow 07/17/2021, 11:39 AM

## 2021-07-17 NOTE — Lactation Note (Signed)
This note was copied from a baby's chart. Lactation Consultation Note  Patient Name: Melissa [name] Today's Date: 07/17/2021 Reason for consult: Follow-up assessment;Mother's request;Primapara;1st time breastfeeding;Term;Infant weight loss;Other (Comment);Hyperbilirubinemia Age:19 hours  Maternal Data Has patient been taught Hand Expression?: Yes  Feeding Mother's Current Feeding Choice: Breast Milk  LATCH Score Latch: Repeated attempts needed to sustain latch, nipple held in mouth throughout feeding, stimulation needed to elicit sucking reflex. (Infant at first popping on and off, worked on different positons and with breast compression and increase volume he latched longer)  Audible Swallowing: Spontaneous and intermittent (different positions able to hear increase in audible swallows with breast compression)  Type of Nipple: Everted at rest and after stimulation  Comfort (Breast/Nipple): Soft / non-tender  Hold (Positioning): Assistance needed to correctly position infant at breast and maintain latch.  LATCH Score: 8   Lactation Tools Discussed/Used Tools: Pump;Flanges Flange Size: 24 Breast pump type: Double-Electric Breast Pump Pump Education: Setup, frequency, and cleaning;Milk Storage Reason for Pumping: increase stimulation Pumping frequency: every 3 hrs for 15 in  Interventions Interventions: Breast feeding basics reviewed;Breast compression;Assisted with latch;Adjust position;Skin to skin;Support pillows;DEBP;Breast massage;Hand express;Expressed milk;Education;Position options  Discharge    Consult Status Consult Status: Follow-up Date: 07/18/21 Follow-up type: In-patient    Tylicia Sherman  Nicholson-Springer 07/17/2021, 9:51 PM

## 2021-07-18 MED ORDER — IBUPROFEN 600 MG PO TABS: 600.0000 mg | ORAL_TABLET | Freq: Four times a day (QID) | ORAL | 0 refills | Status: DC

## 2021-07-18 MED ORDER — COCONUT OIL OIL: 1.0000 "application " | TOPICAL_OIL | 0 refills | Status: DC | PRN

## 2021-07-18 MED ORDER — SIMETHICONE 80 MG PO CHEW: 80.0000 mg | CHEWABLE_TABLET | ORAL | 0 refills | Status: DC | PRN

## 2021-07-18 MED ORDER — NORETHINDRONE 0.35 MG PO TABS: 1.0000 | ORAL_TABLET | Freq: Every day | ORAL | 11 refills | Status: DC

## 2021-07-18 MED ORDER — ACETAMINOPHEN 325 MG PO TABS: 650.0000 mg | ORAL_TABLET | ORAL | Status: DC | PRN

## 2021-07-18 NOTE — Lactation Note (Signed)
This note was copied from a baby's chart. Lactation Consultation Note  Patient Name: Melissa [name] Today's Date: 07/18/2021 Reason for consult: Follow-up assessment;Primapara;1st time breastfeeding;Term Age:19 hours/ 7 % weight loss  LC reviewed LC plan Feed the baby with feeding cues and by 3 hours.  If the baby is to fussy to latch - give him an appetizer from the bottle and then latch.  Supplement afterwards.  Per mom plans to obtain a DEBP . Has the hand pump for D/C.  Engorgement prevention  and tx reviewed and S/S of mastitis.    Maternal Data    Feeding Mother's Current Feeding Choice: Breast Milk  LATCH Score                    Lactation Tools Discussed/Used    Interventions    Discharge Discharge Education: Engorgement and breast care;Warning signs for feeding baby  Consult Status Consult Status: Complete Date: 07/18/21    [name] 07/18/2021, 3:29 PM

## 2021-07-22 ENCOUNTER — Encounter: Payer: Medicaid Other | Admitting: Certified Nurse Midwife

## 2021-07-28 ENCOUNTER — Telehealth (HOSPITAL_COMMUNITY): Payer: Self-pay

## 2021-07-28 DIAGNOSIS — Z1331 Encounter for screening for depression: Secondary | ICD-10-CM

## 2021-07-28 NOTE — Telephone Encounter (Signed)
"  I'm doing fine." Patient declines any questions or concerns about her healing.  "Baby is doing good. I talked to the nurse today at the pediatrician office about adding formula to my breastmilk. Baby sleeps in a bassinet."RN reviewed ABC's of safe sleep with patient. Patient declines any questions or concerns about baby.  EPDS score is 10. Notified Dr. Adrian Blackwater of EPDS score and referral placed for integrated behavioral health.  Reviewed Maternal Mental health resources with patient ([facility], National Maternal Mental Health Hotline, Potpartum Support International) and [facility] postpartum support groups.   Marcelino Duster [facility] 07/28/2021,1502

## 2021-08-03 ENCOUNTER — Telehealth: Payer: Self-pay | Admitting: Clinical

## 2021-08-03 NOTE — Telephone Encounter (Signed)
Left HIPPA-compliant message to call back [name] from Center for [employer] at [facility] at  [phone] [facility] office); left MyChart message for patient.

## 2021-08-04 NOTE — BH Specialist Note (Signed)
Integrated Behavioral Health via Telemedicine Visit  08/04/2021 [name] [id number]  Number of Integrated Behavioral Health visits: 1 Session Start time: 2:12  Session End time: 2:25 Total time:  13  Referring Provider: Candelaria Celeste, DO Patient/Family location: Home [facility] Provider location: Center for [facility] at [facility]  All persons participating in visit: Patient Melissa [name] and [facility] Bricyn Labrada   Types of Service: Individual psychotherapy  I connected with [name] and/or [name]'s  n/a  via  Telephone or Video Enabled Telemedicine Application  (Video is Caregility application) and verified that I am speaking with the correct person using two identifiers. Discussed confidentiality: Yes   I discussed the limitations of telemedicine and the availability of in person appointments.  Discussed there is a possibility of technology failure and discussed alternative modes of communication if that failure occurs.  I discussed that engaging in this telemedicine visit, they consent to the provision of behavioral healthcare and the services will be billed under their insurance.  Patient and/or legal guardian expressed understanding and consented to Telemedicine visit: Yes   Presenting Concerns: Patient and/or family reports the following symptoms/concerns: Pt adjusting to new motherhood, sleeping well, appetite is returning, family/friends supportive; pt and baby are bonding well. Pt has no concerns today.  Duration of problem: Postpartum; Severity of problem: mild  Patient and/or Family's Strengths/Protective Factors: Social connections, Concrete supports in place (healthy food, safe environments, etc.), Sense of purpose, and Physical Health (exercise, healthy diet, medication compliance, etc.)  Goals Addressed: Patient will:  Demonstrate ability to: Increase healthy adjustment to current life circumstances  Progress towards  Goals: Ongoing  Interventions: Interventions utilized:  Supportive Counseling Standardized Assessments completed: GAD-7 and PHQ 9  Patient and/or Family Response: Pt agrees with treatment plan  Assessment: Patient currently experiencing Other specified counseling.   Patient may benefit from supportive counseling today.  Plan: Follow up with behavioral health clinician on : Call [name] as needed at [phone] Behavioral recommendations:  -Continue prioritizing healthy self-care daily, and accepting healthy support from family and friends Referral(s): Integrated Art gallery manager (In Clinic) and [employer]:  new mom support groups  I discussed the assessment and treatment plan with the patient and/or parent/guardian. They were provided an opportunity to ask questions and all were answered. They agreed with the plan and demonstrated an understanding of the instructions.   They were advised to call back or seek an in-person evaluation if the symptoms worsen or if the condition fails to improve as anticipated.  [name], LCSW  Depression screen [facility] 2/9 08/06/2021 07/15/2021 07/08/2021 06/10/2021 05/25/2021  Decreased Interest 1 0 0 0 0  Down, Depressed, Hopeless 0 0 0 0 0  PHQ - 2 Score 1 0 0 0 0  Altered sleeping 1 0 0 0 0  Tired, decreased energy 1 0 0 0 0  Change in appetite 0 0 0 0 0  Feeling bad or failure about yourself  0 0 0 0 0  Trouble concentrating 0 0 0 0 0  Moving slowly or fidgety/restless 1 0 0 0 0  Suicidal thoughts 0 0 0 0 0  PHQ-9 Score 4 0 0 0 0  Difficult doing work/chores - - - - -  Some recent data might be hidden   GAD 7 : Generalized Anxiety Score 08/06/2021 07/15/2021 07/08/2021 06/10/2021  Nervous, Anxious, on Edge 0 0 0 0  Control/stop worrying 0 0 0 0  Worry too much - different things 1  0 0 0  Trouble relaxing 0 0 0 0  Restless 0 0 0 0  Easily annoyed or irritable 1 0 0 0  Afraid - awful might happen 0 0 0 0  Total GAD 7 Score 2 0 0  0  Anxiety Difficulty - - - -

## 2021-08-06 ENCOUNTER — Ambulatory Visit: Payer: Medicaid Other | Admitting: Clinical

## 2021-08-06 DIAGNOSIS — Z7189 Other specified counseling: Secondary | ICD-10-CM

## 2021-08-06 NOTE — Patient Instructions (Signed)
Center for [employer] [facility] at [facility] [address] [place], [place] [address] [phone] (main office) [phone] (Juanluis Guastella's office)  New mom online support groups: [email or web address]  [email or web address]

## 2021-08-21 ENCOUNTER — Other Ambulatory Visit: Payer: Self-pay | Admitting: Family

## 2021-08-21 ENCOUNTER — Other Ambulatory Visit: Payer: Self-pay

## 2021-08-21 ENCOUNTER — Encounter: Payer: Self-pay | Admitting: Family

## 2021-08-21 ENCOUNTER — Ambulatory Visit (INDEPENDENT_AMBULATORY_CARE_PROVIDER_SITE_OTHER): Payer: Medicaid Other | Admitting: Family

## 2021-08-21 VITALS — BP 122/73 | HR 76 | Ht 61.0 in | Wt 109.9 lb

## 2021-08-21 DIAGNOSIS — Z30011 Encounter for initial prescription of contraceptive pills: Secondary | ICD-10-CM | POA: Diagnosis not present

## 2021-08-21 DIAGNOSIS — Z23 Encounter for immunization: Secondary | ICD-10-CM | POA: Diagnosis not present

## 2021-08-21 MED ORDER — NORETHIN ACE-ETH ESTRAD-FE 1-20 MG-MCG(24) PO TABS: 1.0000 | ORAL_TABLET | Freq: Every day | ORAL | 11 refills | Status: DC

## 2021-08-21 NOTE — Progress Notes (Signed)
    Post Partum Visit Note  Melissa [name] is a 20 y.o. G16P1001 female who presents for a postpartum visit. She is 5 weeks postpartum following a normal spontaneous vaginal delivery.  I have fully reviewed the prenatal and intrapartum course. The delivery was at 39/5 gestational weeks.  Anesthesia: epidural. Postpartum course has been unremarkable. Baby is doing well. Baby is feeding by breast. Bleeding no bleeding. Bowel function is normal. Bladder function is normal. Patient is not sexually active. Great social support, aunt, father of the baby, his mother.  Still maintains friend network.  Contraception method is OCP (estrogen/progesterone). Postpartum depression screening: negative.   The pregnancy intention screening data noted above was reviewed. Potential methods of contraception were discussed. The patient elected to proceed with No data recorded.    Health Maintenance Due  Topic Date Due   HPV VACCINES (1 - 2-dose series) Never done   INFLUENZA VACCINE  07/06/2021    The following portions of the patient's history were reviewed and updated as appropriate: allergies, current medications, past family history, past medical history, past social history, past surgical history, and problem list.  Review of Systems Pertinent items are noted in HPI.  Objective:  BP 122/73   Pulse 76   Ht 5\' 1"  (1.549 m)   Wt 109 lb 14.4 oz (49.9 kg)   Breastfeeding Yes   BMI 20.77 kg/m    General:  alert, cooperative and appears stated age   Breasts:  inspection negative, no nipple discharge or bleeding,  Lungs: clear to auscultation bilaterally  Heart:  regular rate and rhythm, S1, S2 normal, no murmur, click, rub or gallop  Abdomen: soft, non-tender; bowel sounds normal; no masses,  no organomegaly  Pelvic exam not indicated - not bleeding, no pain at vaginal area      Assessment:   Normal Postpartum Exam  Plan:   Essential components of care per ACOG recommendations:  1.  Mood and  well being: Patient with negative depression screening today. Reviewed local resources for support.  - Patient tobacco use? No.   - hx of drug use? No.   2. Infant care and feeding:  -Patient currently breastmilk feeding? Yes. Reviewed importance of draining breast regularly to support lactation.  -Social determinants of health (SDOH) reviewed in EPIC. No concerns.    3. Sexuality, contraception and birth spacing - Patient does not want a pregnancy in the next year.  Desired family size is 2 children.  - Reviewed forms of contraception in tiered fashion. Patient desired oral contraceptives (estrogen/progesterone) today.   - Discussed birth spacing of 18 months  4. Sleep and fatigue -Encouraged family/partner/community support of 4 hrs of uninterrupted sleep to help with mood and fatigue  5. Physical Recovery  - Discussed patients delivery and complications. She describes her labor as good. - Patient had a Vaginal, no problems at delivery. Patient had a 1st degree laceration. Perineal healing reviewed. Patient expressed understanding - Patient has urinary incontinence? No. - Patient is safe to resume physical and sexual activity  6.  Health Maintenance - HM due items addressed Yes - Last pap smear No results found for: DIAGPAP Pap smear not done at today's visit.  -Breast Cancer screening indicated? No.   7. Chronic Disease/Pregnancy Condition follow up: None  - PCP follow up  , [facility] for [facility]   [place], [place], Kae Heller

## 2022-07-14 IMAGING — US US MFM OB LIMITED
1 series · 15 of 28 positions shown · non-contrast
Comparison: none

[Series 1: us mfm ob limited · 41 acquisitions, 15 frames shown]
[im 1/41]
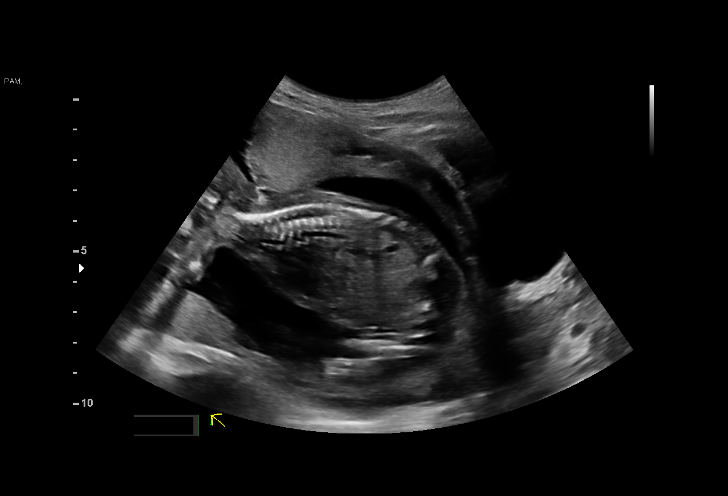
[im 3/41]
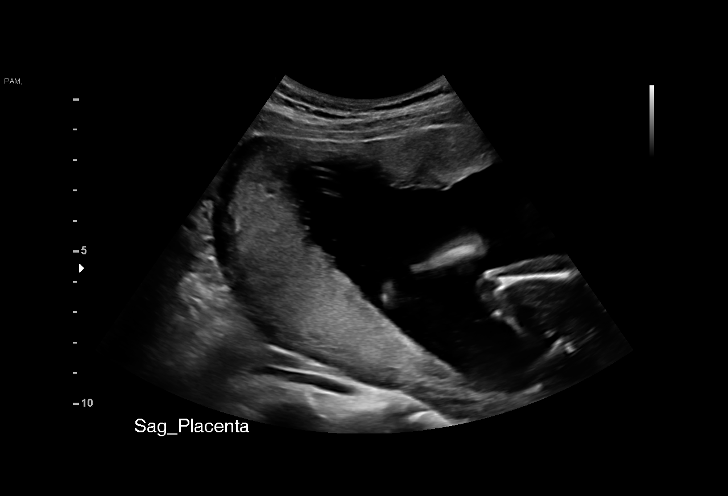
[im 6/41]
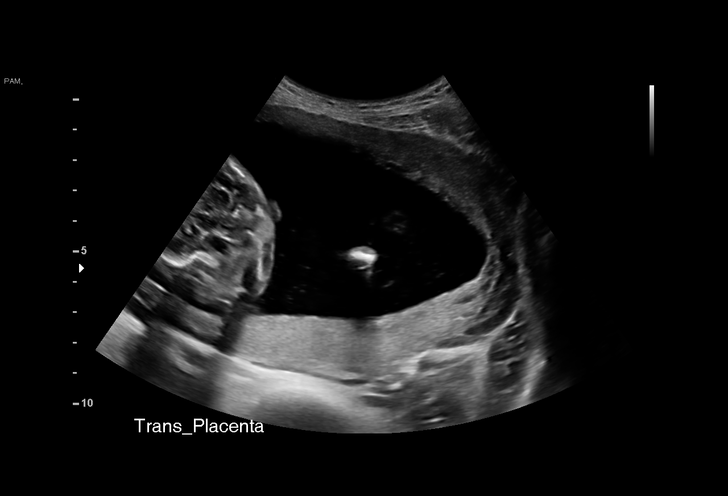
[im 9/41]
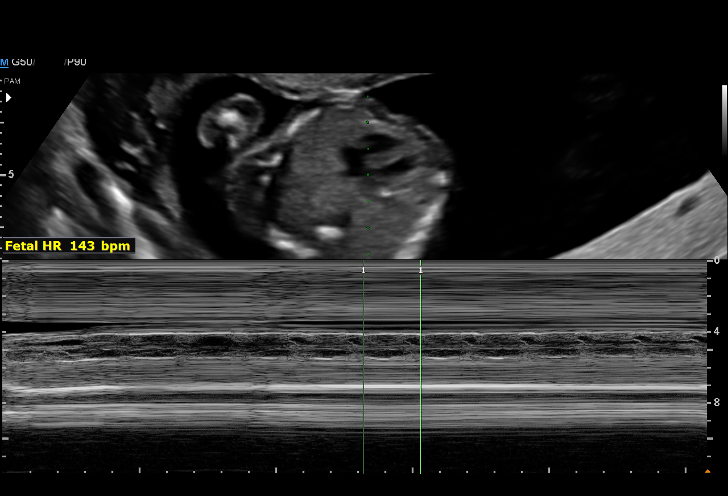
[im 12/41]
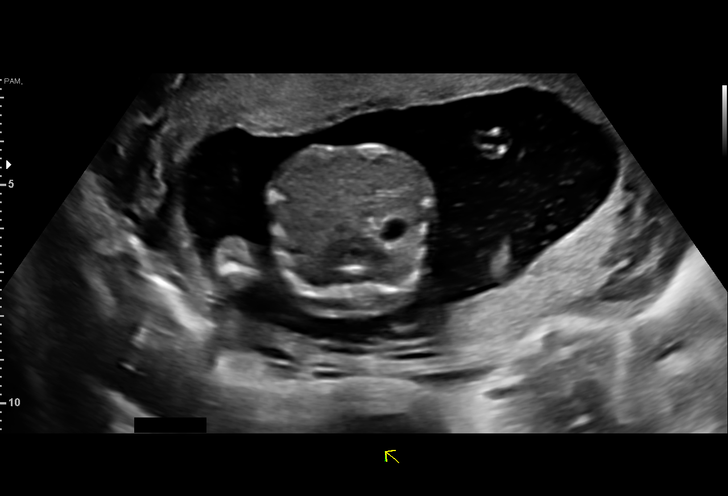
[im 15/41]
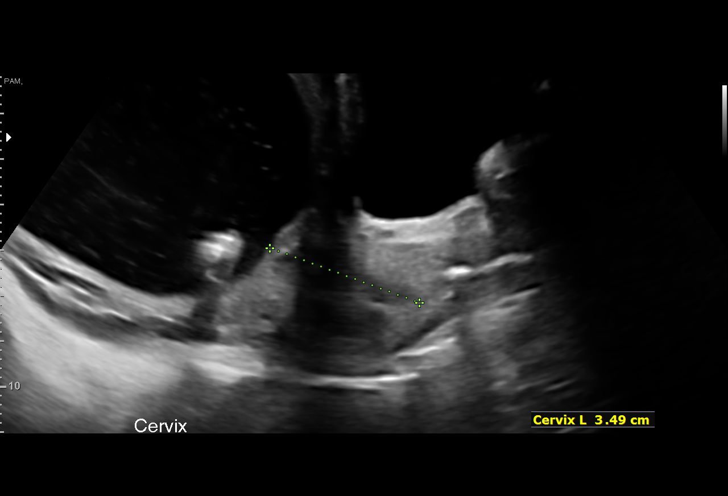
[im 18/41]
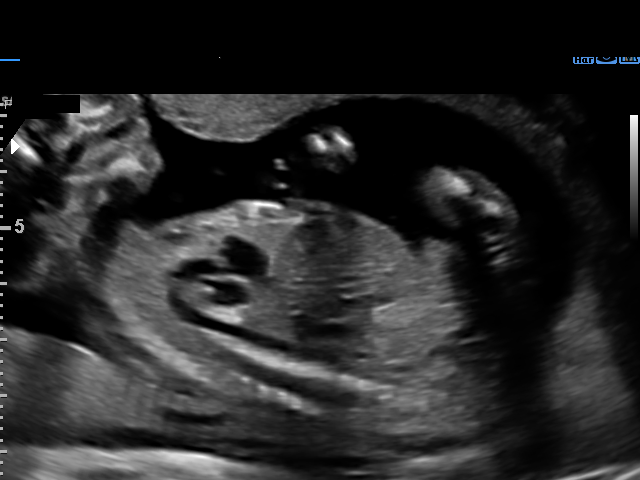
[im 21/41]
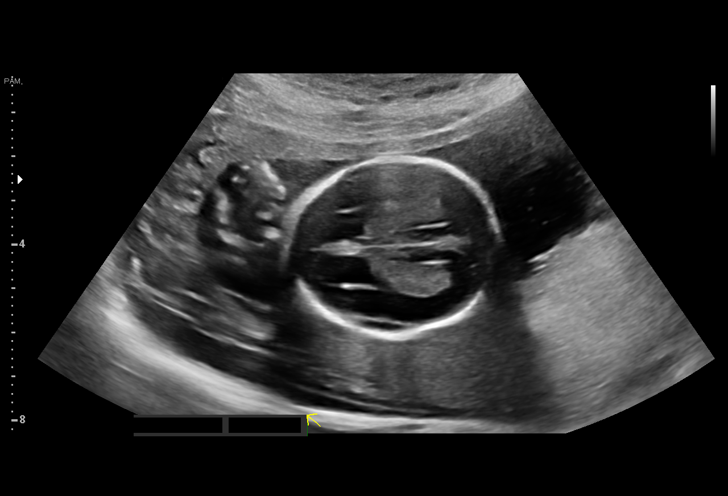
[im 23/41]
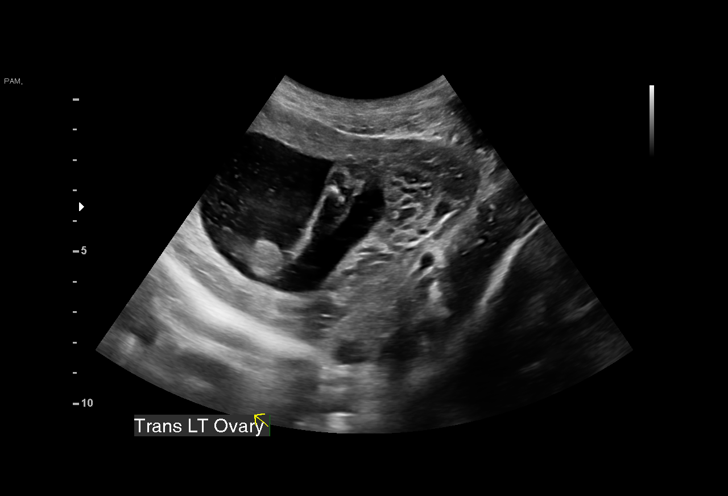
[im 26/41]
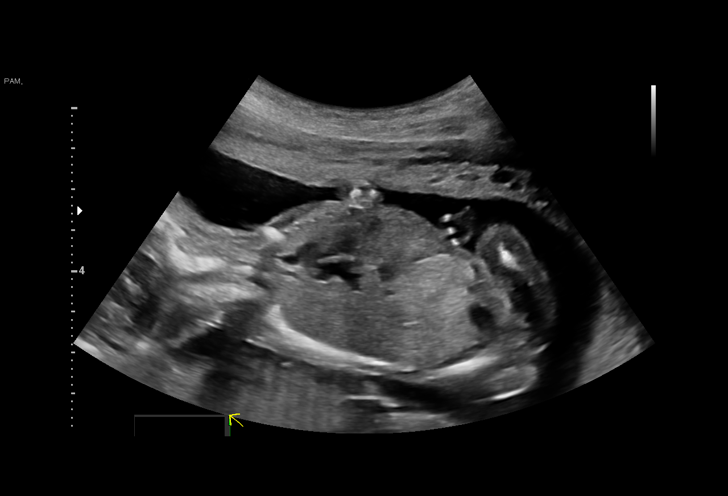
[im 29/41]
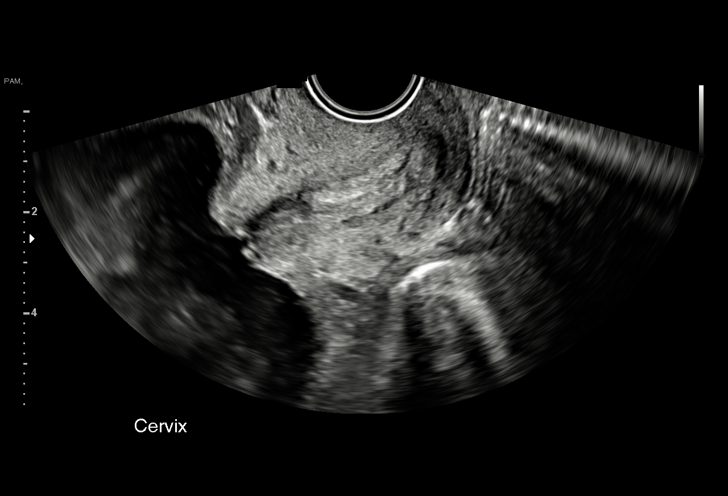
[im 32/41]
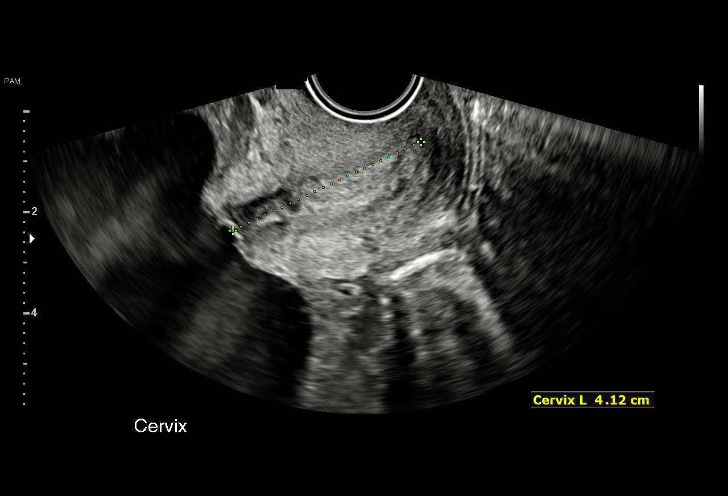
[im 35/41]
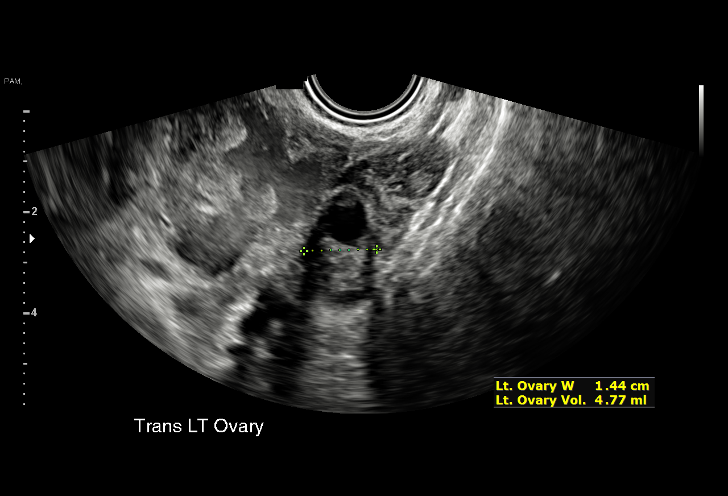
[im 38/41]
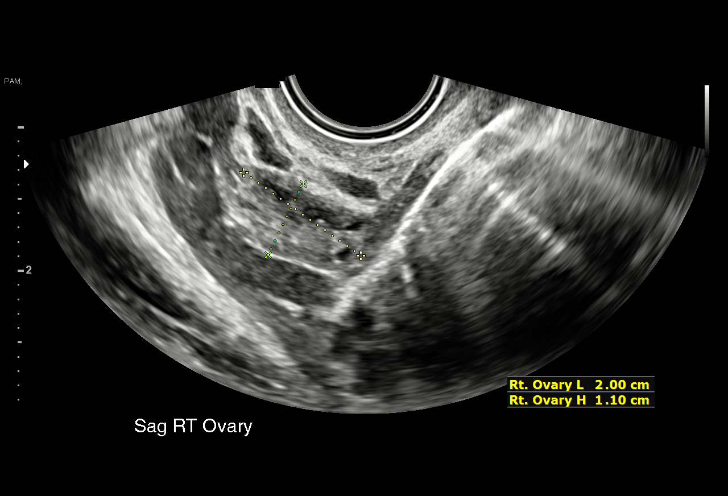
[im 41/41]
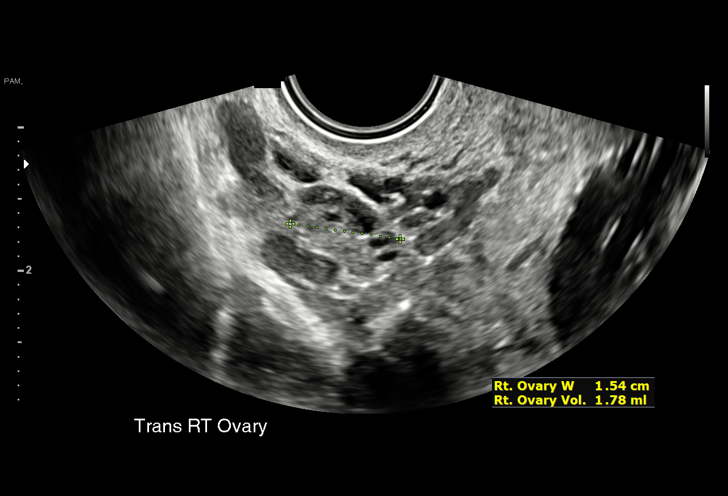

[15 of 28 positions shown; findings below may reference images not displayed]

1  US MFM OB LIMITED                     76815.01    PHONEBOX YUGOV

Indications

 18 weeks gestation of pregnancy
 Pelvic pain affecting pregnancy in second
 trimester
Fetal Evaluation

 Num Of Fetuses:         1
 Fetal Heart Rate(bpm):  143
 Cardiac Activity:       Observed
 Presentation:           Breech
 Placenta:               Posterior

 Amniotic Fluid
 AFI FV:      Within normal limits

                             Largest Pocket(cm)

OB History

 Gravidity:    1
 Living:       0
Gestational Age

 LMP:           18w 1d        Date:  10/11/20                 EDD:   07/18/21
 Best:          18w 1d     Det. By:  LMP  (10/11/20)          EDD:   07/18/21
Anatomy

 Cranium:               Appears normal         Diaphragm:              Appears normal
 Choroid Plexus:        Appears normal         Stomach:                Appears normal, left
                                                                       sided
 Cerebellum:            Appears normal         Kidneys:                Appear normal
 Face:                  Profile appears        Bladder:                Appears normal
                        normal
 Heart:                 Appears normal
                        (4CH, axis, and
                        situs)
Cervix Uterus Adnexa

 Cervix
 Length:            4.2  cm.
 Normal appearance by transvaginal scan

 Right Ovary
 Size(cm)        2   x   1.54   x  1.1       Vol(ml):
 Within normal limits.

 Left Ovary
 Size(cm)     2.46   x   1.44   x  2.57      Vol(ml):
 Within normal limits.
Impression

 Patient was evaluated for c/o lowerabdominal pain and
 pressure. No history of vaginal bleeding.

 A limited ultrasound study was performed .Amniotic fluid is
 normal and good fetal activity is seen . Placenta appears
 normal. On transvaginal scan, the cervix measures 4.2 cm,
 which is within normal range.

                 Jim, [place]

## 2022-10-15 IMAGING — US US MFM OB FOLLOW-UP
1 series · 14 of 28 positions shown · non-contrast
Comparison: none

[Series 1: us mfm ob follow-up · 73 acquisitions, 14 frames shown]
[im 3/73]
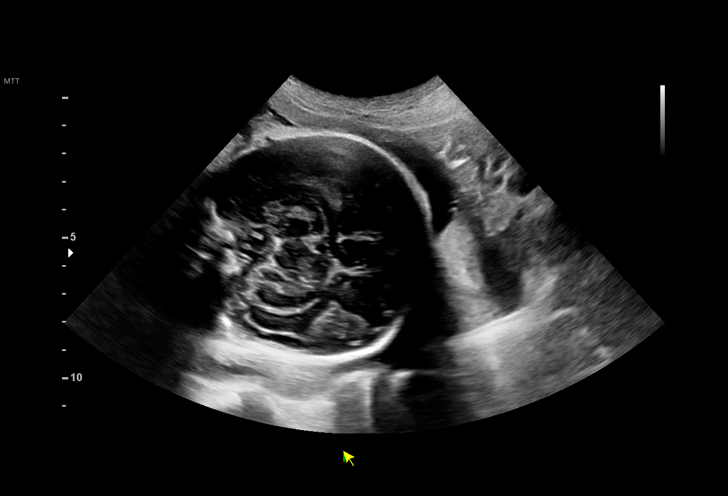
[im 9/73]
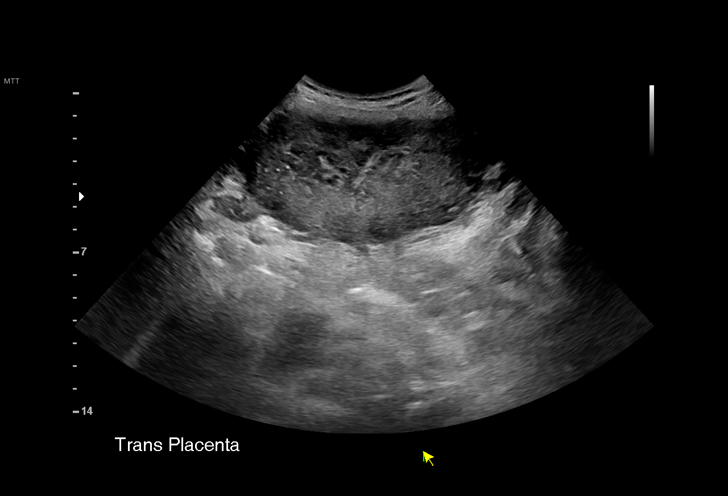
[im 14/73]
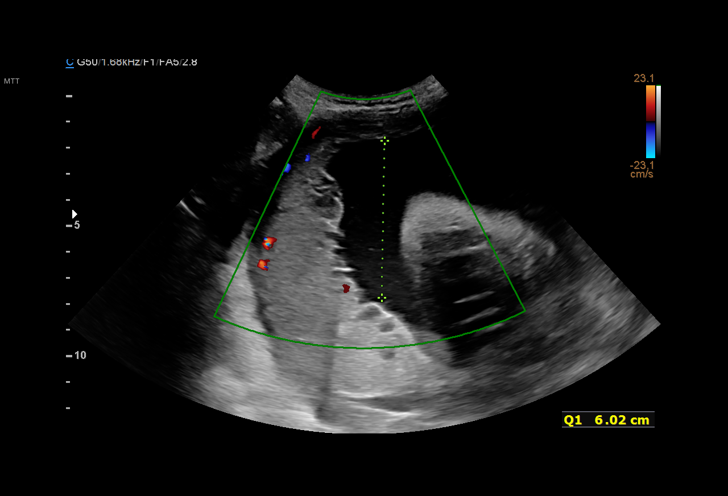
[im 19/73]
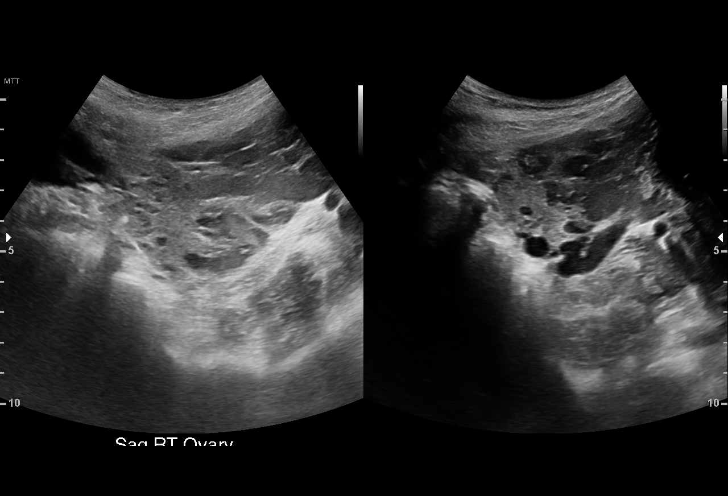
[im 25/73]
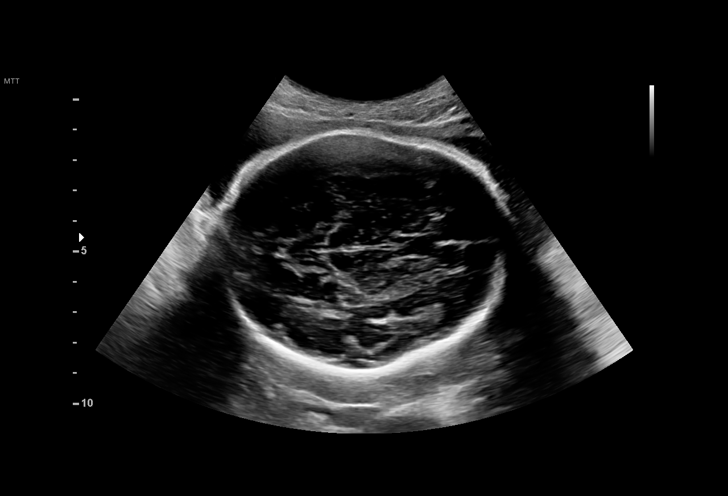
[im 30/73]
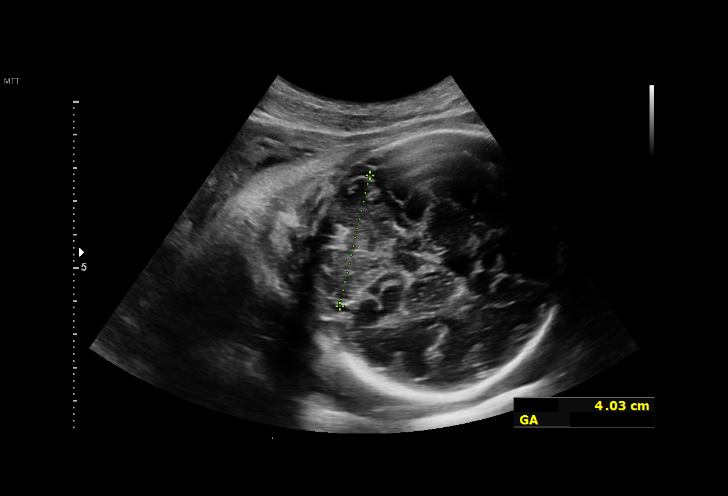
[im 35/73]
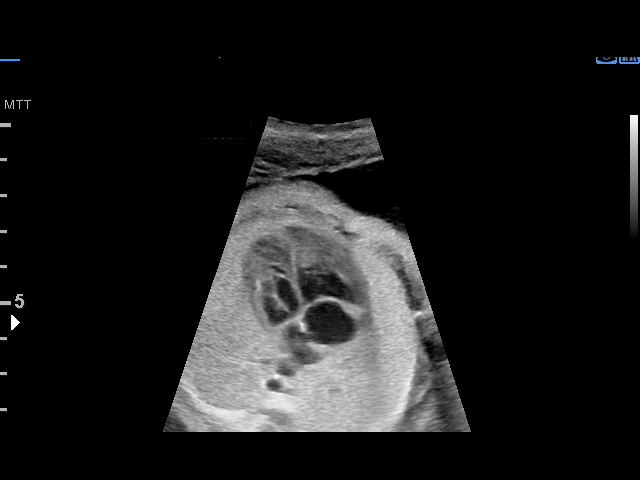
[im 41/73]
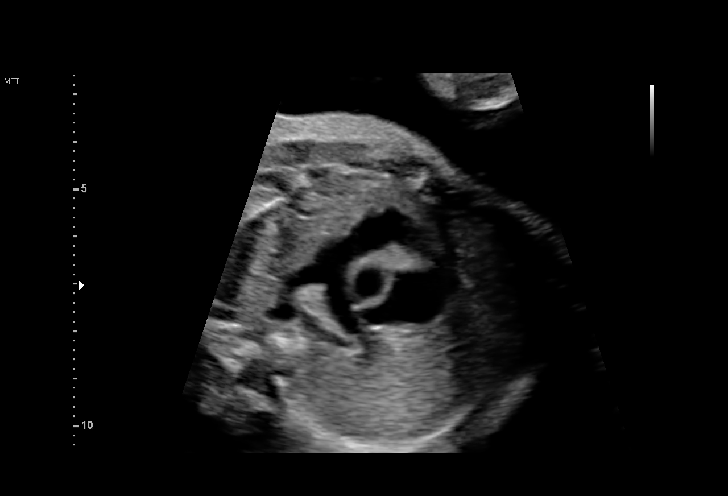
[im 46/73]
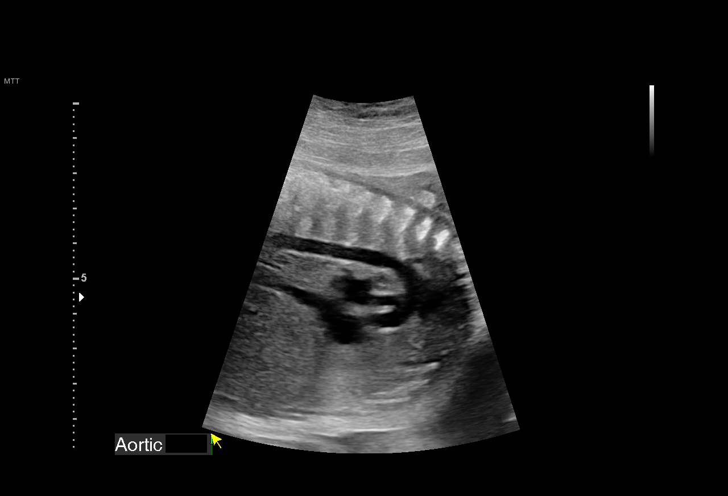
[im 51/73]
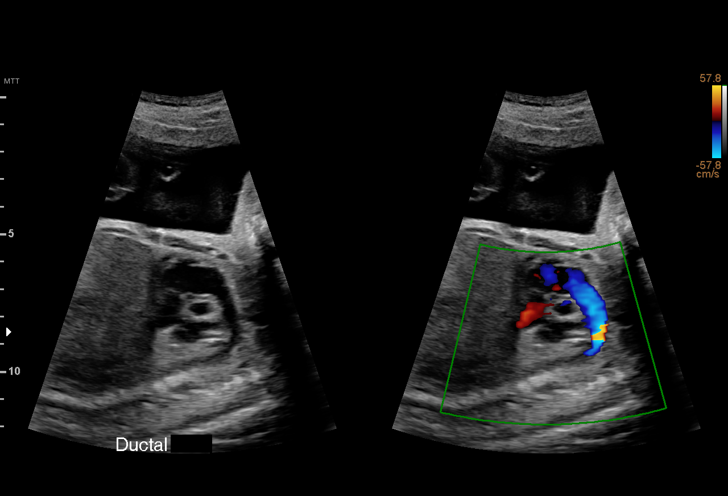
[im 57/73]
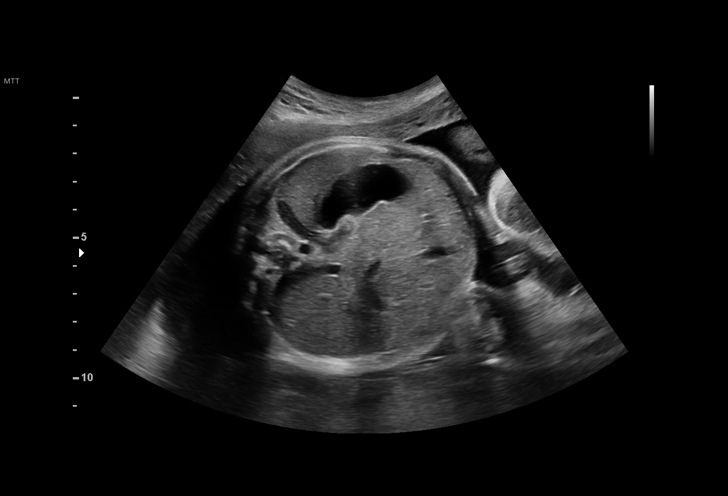
[im 62/73]
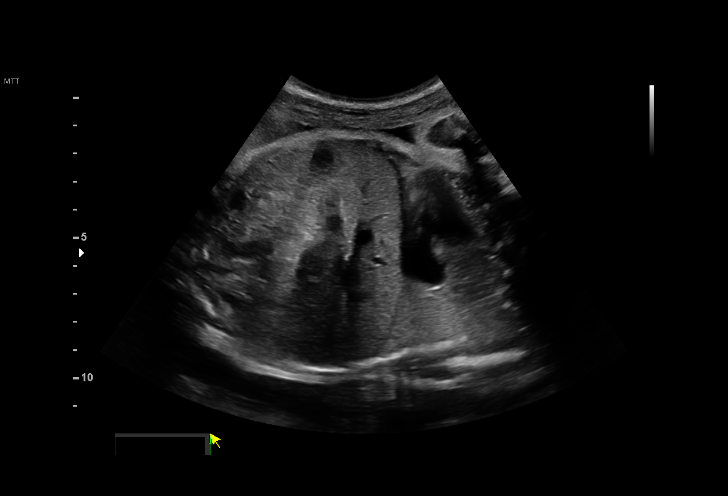
[im 67/73]
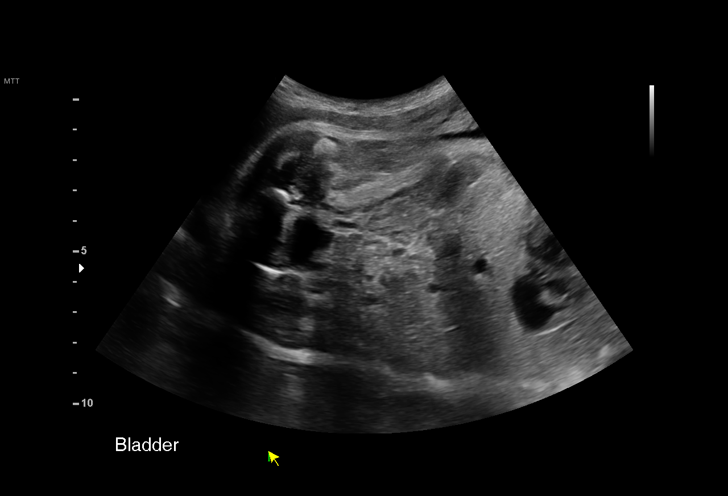
[im 73/73]
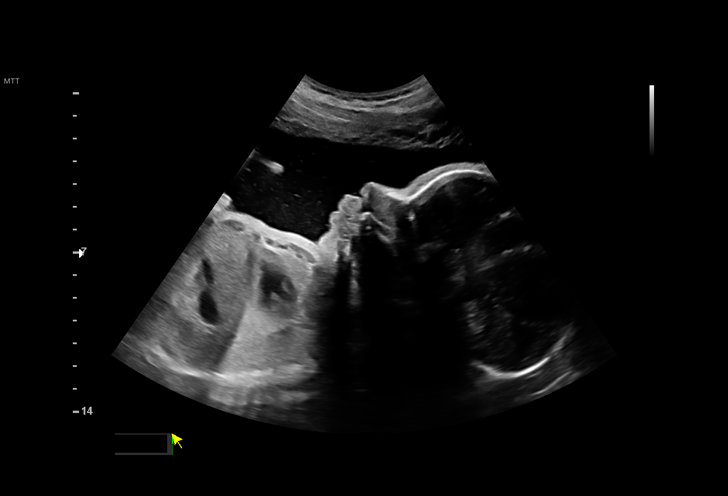

[14 of 28 positions shown; findings below may reference images not displayed]

Indications

 31 weeks gestation of pregnancy
 LR NIPS
 Encounter for other antenatal screening
 follow-up
Fetal Evaluation

 Num Of Fetuses:         1
 Fetal Heart Rate(bpm):  143
 Cardiac Activity:       Observed
 Presentation:           Cephalic
 Placenta:               Posterior
 P. Cord Insertion:      Previously Visualized

 Amniotic Fluid
 AFI FV:      Within normal limits

 AFI Sum(cm)     %Tile       Largest Pocket(cm)
 21.9            85

 RUQ(cm)       RLQ(cm)       LUQ(cm)        LLQ(cm)
 6
Biometry

 BPD:      78.9  mm     G. Age:  31w 5d         47  %    CI:        79.09   %    70 - 86
                                                         FL/HC:      20.3   %    19.3 -
 HC:      280.5  mm     G. Age:  30w 5d          6  %    HC/AC:      1.00        0.96 -
 AC:      280.4  mm     G. Age:  32w 1d         67  %    FL/BPD:     72.1   %    71 - 87
 FL:       56.9  mm     G. Age:  29w 6d          6  %    FL/AC:      20.3   %    20 - 24
 CER:      40.3  mm     G. Age:  32w 3d         57  %
 LV:        2.1  mm
 Est. FW:    4363  gm    3 lb 13 oz      32  %
OB History

 Gravidity:    1
 Living:       0
Gestational Age

 LMP:           31w 3d        Date:  10/11/20                 EDD:   07/18/21
 U/S Today:     31w 1d                                        EDD:   07/20/21
 Best:          31w 3d     Det. By:  LMP  (10/11/20)          EDD:   07/18/21
Anatomy

 Cranium:               Appears normal         LVOT:                   Appears normal
 Cavum:                 Appears normal         Aortic Arch:            Appears normal
 Ventricles:            Appears normal         Ductal Arch:            Previously seen
 Choroid Plexus:        Appears normal         Diaphragm:              Appears normal
 Cerebellum:            Appears normal         Stomach:                Appears normal, left
                                                                       sided
 Posterior Fossa:       Previously seen        Abdomen:                Appears normal
 Nuchal Fold:           Previously seen        Abdominal Wall:         Previously seen
 Face:                  Orbits and profile     Cord Vessels:           Previously seen
                        previously seen
 Lips:                  Previously seen        Kidneys:                Appear normal
 Palate:                Previously seen        Bladder:                Appears normal
 Thoracic:              Appears normal         Spine:                  Previously seen
 Heart:                 Appears normal;        Upper Extremities:      Previously seen
                        EIF in LV
 RVOT:                  Appears normal         Lower Extremities:      Previously seen

 Other:  Fetus appears to be a male. Nasal bone, Heels/feet and open
         hands/5th digits visualized. VC, 3VV and 3VTV visualized. Lt Renal
         collecting system prominent.
Cervix Uterus Adnexa

 Cervix
 Length:           3.14  cm.
 Normal appearance by transabdominal scan.

 Uterus
 No abnormality visualized.

 Right Ovary
 Within normal limits.

 Left Ovary
 Within normal limits.

 Cul De Sac
 No free fluid seen.

 Adnexa
 No abnormality visualized.
Impression

 Teen pregnancy.Patient returned for fetal growth assessment.
 Amniotic fluid is normal and good fetal activity is seen .Fetal
 growth is appropriate for gestational age .

 BP at our office: 113/70 mm Hg .
Recommendations

 -Follow-up scans as clinically indicated.
                 Jumper, Blain

## 2024-03-05 ENCOUNTER — Ambulatory Visit

## 2024-08-29 ENCOUNTER — Ambulatory Visit (INDEPENDENT_AMBULATORY_CARE_PROVIDER_SITE_OTHER)

## 2024-08-29 ENCOUNTER — Other Ambulatory Visit: Payer: Self-pay

## 2024-08-29 ENCOUNTER — Other Ambulatory Visit (HOSPITAL_COMMUNITY)
Admission: RE | Admit: 2024-08-29 | Discharge: 2024-08-29 | Disposition: A | Discharge status: Home / Self Care | Source: Ambulatory Visit | Attending: Obstetrics and Gynecology | Admitting: Obstetrics and Gynecology

## 2024-08-29 VITALS — BP 103/69 | HR 88 | Wt 98.0 lb

## 2024-08-29 DIAGNOSIS — Z3480 Encounter for supervision of other normal pregnancy, unspecified trimester: Secondary | ICD-10-CM | POA: Insufficient documentation

## 2024-08-29 DIAGNOSIS — Z3481 Encounter for supervision of other normal pregnancy, first trimester: Secondary | ICD-10-CM | POA: Diagnosis not present

## 2024-08-29 LAB — POCT URINE PREGNANCY: Preg Test, Ur: POSITIVE — AB

## 2024-08-29 NOTE — Progress Notes (Signed)
 New OB Intake  I explained I am completing New OB Intake today. We discussed EDD of 04/27/2025, by Last Menstrual Period. Pt is G2P1001. I reviewed her allergies, medications and Medical/Surgical/OB history.    Patient Active Problem List   Diagnosis Date Noted   Supervision of normal intrauterine pregnancy in multigravida 08/29/2024   Vaginal delivery 07/15/2021   GBS (group B Streptococcus carrier), +RV culture, currently pregnant 07/12/2021   Supervision of low-risk pregnancy 01/02/2021    Concerns addressed today  Patient informed that the ultrasound is considered a limited obstetric ultrasound and is not intended to be a complete ultrasound exam.  Patient also informed that the ultrasound is not being completed with the intent of assessing for fetal or placental anomalies or any pelvic abnormalities. Explained that the purpose of today's ultrasound is to assess for viability.  Patient acknowledges the purpose of the exam and the limitations of the study.     Delivery Plans Plans to deliver at [facility]. Discussed the nature of our practice with multiple providers including residents and students. Due to the size of the practice, the delivering provider may not be the same as those providing prenatal care.   MyChart/Babyscripts MyChart access verified. I explained pt will have some visits in office and some virtually. Babyscripts app discussed and ordered.   Blood Pressure Cuff Blood pressure cuff discussedDiscussed to be used for virtual visits and or if needed BP checks weekly.  Anatomy US  Explained first scheduled US  will be around 19 weeks.   Last Pap No results found for: DIAGPAP  First visit review I reviewed new OB appt with patient. Explained pt will be seen by Dr. Barbra at first visit. Discussed [place] genetic screening with patient and significant other. Routine prenatal labs ordered.    Erminio DELENA Rumps, [place] 08/29/2024  3:02 PM

## 2024-08-31 LAB — CBC/D/PLT+RPR+RH+ABO+RUBIGG...
Basophils Absolute: 0.1 x10E3/uL (ref 0.0–0.2)
Basos: 1 %
EOS (ABSOLUTE): 0 x10E3/uL (ref 0.0–0.4)
Eos: 0 %
HCV Ab: NONREACTIVE
HIV Screen 4th Generation wRfx: NONREACTIVE
Hematocrit: 41.1 % (ref 34.0–46.6)
Hemoglobin: 12.8 g/dL (ref 11.1–15.9)
Hepatitis B Surface Ag: NEGATIVE
Immature Grans (Abs): 0 x10E3/uL (ref 0.0–0.1)
Immature Granulocytes: 0 %
Lymphocytes Absolute: 1.9 x10E3/uL (ref 0.7–3.1)
Lymphs: 29 %
MCH: 25.1 pg — ABNORMAL LOW (ref 26.6–33.0)
MCHC: 31.1 g/dL — ABNORMAL LOW (ref 31.5–35.7)
MCV: 81 fL (ref 79–97)
Monocytes Absolute: 0.7 x10E3/uL (ref 0.1–0.9)
Monocytes: 10 %
Neutrophils Absolute: 4.1 x10E3/uL (ref 1.4–7.0)
Neutrophils: 60 %
Platelets: 338 x10E3/uL (ref 150–450)
RBC: 5.1 x10E6/uL (ref 3.77–5.28)
RDW: 14.5 % (ref 11.7–15.4)
RPR Ser Ql: NONREACTIVE
Rh Factor: POSITIVE
Rubella Antibodies, IGG: 13.8 {index} (ref >0.99)
WBC: 6.7 x10E3/uL (ref 3.4–10.8)

## 2024-08-31 LAB — CERVICOVAGINAL ANCILLARY ONLY
Chlamydia: NEGATIVE
Comment: NEGATIVE
Comment: NORMAL
Neisseria Gonorrhea: NEGATIVE

## 2024-08-31 LAB — AB SCR+ANTIBODY ID
Antibody Screen: POSITIVE — AB
Coombs Titer #1: 4

## 2024-08-31 LAB — HCV INTERPRETATION

## 2024-09-01 ENCOUNTER — Ambulatory Visit: Payer: Self-pay | Admitting: Obstetrics and Gynecology

## 2024-09-01 DIAGNOSIS — O361990 Maternal care for other isoimmunization, unspecified trimester, not applicable or unspecified: Secondary | ICD-10-CM | POA: Insufficient documentation

## 2024-09-01 DIAGNOSIS — O361910 Maternal care for other isoimmunization, first trimester, not applicable or unspecified: Secondary | ICD-10-CM | POA: Insufficient documentation

## 2024-09-02 LAB — URINE CULTURE, OB REFLEX

## 2024-09-02 LAB — CULTURE, OB URINE

## 2024-09-03 NOTE — Telephone Encounter (Signed)
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 We will need to trend her antibodies and do  serial ultrasounds of the baby. I have referred her to MFM. Please help coordinate for a first trimester ultrasound and consult with MFM asap. Thank you.  ----- Message ----- From: Venus Erminio LABOR, RN Sent: 08/29/2024   3:02 PM EDT To: Rollo ONEIDA Bring, MD

## 2024-09-03 NOTE — Telephone Encounter (Signed)
 Patient made aware of lab results.  Waiting for MFM to call and schedule appts.  Will call back if she doesn't hear in the next few days.  Erminio DELENA Rumps, RN

## 2024-09-06 ENCOUNTER — Other Ambulatory Visit: Payer: Self-pay | Admitting: Obstetrics and Gynecology

## 2024-09-06 DIAGNOSIS — O361910 Maternal care for other isoimmunization, first trimester, not applicable or unspecified: Secondary | ICD-10-CM

## 2024-09-10 ENCOUNTER — Telehealth: Payer: Self-pay

## 2024-09-10 NOTE — Telephone Encounter (Signed)
 Spoke with patient regarding appt with MFM.  She has not yet received appt, told patient we will follow up with them to get her scheduled.  Erminio DELENA Rumps, RN

## 2024-09-13 ENCOUNTER — Other Ambulatory Visit: Payer: Self-pay

## 2024-09-13 ENCOUNTER — Other Ambulatory Visit: Payer: Self-pay | Admitting: Obstetrics and Gynecology

## 2024-09-13 ENCOUNTER — Ambulatory Visit (INDEPENDENT_AMBULATORY_CARE_PROVIDER_SITE_OTHER)

## 2024-09-13 DIAGNOSIS — Z3A01 Less than 8 weeks gestation of pregnancy: Secondary | ICD-10-CM | POA: Diagnosis not present

## 2024-09-13 DIAGNOSIS — Z3481 Encounter for supervision of other normal pregnancy, first trimester: Secondary | ICD-10-CM

## 2024-09-17 ENCOUNTER — Telehealth: Payer: Self-pay

## 2024-09-17 DIAGNOSIS — R112 Nausea with vomiting, unspecified: Secondary | ICD-10-CM

## 2024-09-17 DIAGNOSIS — Z3A08 8 weeks gestation of pregnancy: Secondary | ICD-10-CM

## 2024-09-17 MED ORDER — PROMETHAZINE HCL 25 MG PO TABS: 25.0000 mg | ORAL_TABLET | Freq: Four times a day (QID) | ORAL | 0 refills | Status: DC | PRN

## 2024-09-17 NOTE — Telephone Encounter (Signed)
 Patient called requesting a Rx for nausea. Phenergan 25 mg take 1 tablet by mouth every 6 hours was sent to her pharmacy. Advised patient to also try vitamin B6 and 1/2 of a Unisom  tablet. Understanding was voiced. Copeland Lapier l Koni Kannan, CMA

## 2024-09-18 ENCOUNTER — Ambulatory Visit: Payer: Self-pay | Admitting: Obstetrics and Gynecology

## 2024-09-18 ENCOUNTER — Telehealth: Payer: Self-pay

## 2024-10-04 ENCOUNTER — Ambulatory Visit: Admitting: Family Medicine

## 2024-10-04 VITALS — BP 98/66 | HR 91 | Wt 107.1 lb

## 2024-10-04 DIAGNOSIS — Z23 Encounter for immunization: Secondary | ICD-10-CM

## 2024-10-04 DIAGNOSIS — Z3A10 10 weeks gestation of pregnancy: Secondary | ICD-10-CM | POA: Diagnosis not present

## 2024-10-04 DIAGNOSIS — Z3481 Encounter for supervision of other normal pregnancy, first trimester: Secondary | ICD-10-CM

## 2024-10-04 DIAGNOSIS — O99820 Streptococcus B carrier state complicating pregnancy: Secondary | ICD-10-CM | POA: Diagnosis not present

## 2024-10-04 DIAGNOSIS — Z3480 Encounter for supervision of other normal pregnancy, unspecified trimester: Secondary | ICD-10-CM

## 2024-10-04 DIAGNOSIS — O361910 Maternal care for other isoimmunization, first trimester, not applicable or unspecified: Secondary | ICD-10-CM

## 2024-10-04 MED ORDER — ONDANSETRON 4 MG PO TBDP: 4.0000 mg | ORAL_TABLET | Freq: Four times a day (QID) | ORAL | 4 refills | Status: DC | PRN

## 2024-10-05 ENCOUNTER — Encounter: Payer: Self-pay | Admitting: Family Medicine

## 2024-10-05 LAB — HEMOGLOBIN A1C
Est. average glucose Bld gHb Est-mCnc: 97 mg/dL
Hgb A1c MFr Bld: 5 % (ref 4.8–5.6)

## 2024-10-05 NOTE — Progress Notes (Signed)
 Subjective:  Melissa [name] is a G2P1001 [redacted]w[redacted]d being seen today for her first obstetrical visit.  Her obstetrical history is significant for previous low risk vaginal delivery with no complications of prenatal and postpartum course. She did have a TAB earlier this year and needed a blood transfusion due to the amount of bleeding she had following it . Patient does intend to breast feed. Pregnancy history fully reviewed.  Patient reports nausea and vomiting.     10/04/2024    2:12 PM 08/21/2021   11:06 AM 08/06/2021    2:18 PM 07/15/2021   10:22 AM 07/08/2021   10:09 AM  Depression screen PHQ 2/9  Decreased Interest 0 0 1 0 0  Down, Depressed, Hopeless 0 0 0 0 0  PHQ - 2 Score 0 0 1 0 0  Altered sleeping 0 0 1 0 0  Tired, decreased energy 1 0 1 0 0  Change in appetite 1 0 0 0 0  Feeling bad or failure about yourself  0 0 0 0 0  Trouble concentrating 0 0 0 0 0  Moving slowly or fidgety/restless 1 0 1 0 0  Suicidal thoughts 0 0 0 0 0  PHQ-9 Score 3 0 4 0 0     BP 98/66   Pulse 91   Wt 107 lb 1.9 oz (48.6 kg)   LMP 07/21/2024   BMI 20.24 kg/m   HISTORY: OB History  Gravida Para Term Preterm AB Living  2 1 1   1   SAB IAB Ectopic Multiple Live Births     0 1    # Outcome Date GA Lbr Len/2nd Weight Sex Type Anes PTL Lv  2 Current           1 Term 07/16/21 [redacted]w[redacted]d / 00:45 6 lb 4.9 oz (2.86 kg) M Vag-Spont EPI N LIV    Past Medical History:  Diagnosis Date   Medical history non-contributory    Seasonal allergies     Past Surgical History:  Procedure Laterality Date   NO PAST SURGERIES      No family history on file.   Exam  BP 98/66   Pulse 91   Wt 107 lb 1.9 oz (48.6 kg)   LMP 07/21/2024   BMI 20.24 kg/m   Chaperone present during exam  CONSTITUTIONAL: Well-developed, well-nourished female in no acute distress.  HENT:  Normocephalic, atraumatic, External right and left ear normal. Oropharynx is clear and moist EYES: Conjunctivae and EOM are normal. Pupils  are equal, round, and reactive to light. No scleral icterus.  NECK: Normal range of motion, supple, no masses.  Normal thyroid.  CARDIOVASCULAR: Normal heart rate noted, regular rhythm RESPIRATORY: Clear to auscultation bilaterally. Effort and breath sounds normal, no problems with respiration noted. BREASTS: deferred ABDOMEN: Soft, normal bowel sounds, no distention noted.  No tenderness, rebound or guarding.  PELVIC: declined MUSCULOSKELETAL: Normal range of motion. No tenderness.  No cyanosis, clubbing, or edema.  2+ distal pulses. SKIN: Skin is warm and dry. No rash noted. Not diaphoretic. No erythema. No pallor. NEUROLOGIC: Alert and oriented to person, place, and time. Normal reflexes, muscle tone coordination. No cranial nerve deficit noted. PSYCHIATRIC: Normal mood and affect. Normal behavior. Normal judgment and thought content.    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 [redacted] weeks gestation of pregnancy - Flu vaccine trivalent PF, 6mos and older(Flulaval,Afluria,Fluarix,Fluzone)  2. Supervision of normal intrauterine pregnancy in multigravida in first trimester (Primary) FHT normal Add zofran  for daytime nausea - Hemoglobin A1c  3. GBS (group B Streptococcus carrier), +RV culture, currently pregnant Intrapartum ppx  4. 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 Supervision of other normal pregnancy, antepartum  - HORIZON CUSTOM - PANORAMA PRENATAL TEST    Initial labs obtained Continue prenatal vitamins Reviewed n/v relief  measures and warning s/s to report Reviewed recommended weight gain based on pre-gravid BMI Encouraged well-balanced diet Genetic & carrier screening discussed: requests Panorama and Horizon , requests Panorama Ultrasound discussed; fetal survey: requested CCNC completed> form faxed if has or is planning to apply for medicaid The nature of [facility] with multiple MDs and other Advanced Practice Providers was explained to patient; also emphasized that fellows, residents, and students are part of our team.    Problem list reviewed and updated. 75% of 30 min visit spent on counseling and coordination of care.     Chelcy Bolda J Sewell Pitner 10/05/2024

## 2024-10-09 ENCOUNTER — Telehealth: Payer: Self-pay

## 2024-10-09 ENCOUNTER — Encounter (HOSPITAL_COMMUNITY): Payer: Self-pay

## 2024-10-09 ENCOUNTER — Other Ambulatory Visit: Payer: Self-pay

## 2024-10-09 ENCOUNTER — Emergency Department (HOSPITAL_COMMUNITY)
Admission: EM | Admit: 2024-10-09 | Discharge: 2024-10-10 | Disposition: A | Discharge status: Other Acute Inpatient Hospital | Source: Home / Self Care | Attending: Emergency Medicine | Admitting: Emergency Medicine

## 2024-10-09 DIAGNOSIS — O99341 Other mental disorders complicating pregnancy, first trimester: Secondary | ICD-10-CM | POA: Insufficient documentation

## 2024-10-09 DIAGNOSIS — F329 Major depressive disorder, single episode, unspecified: Secondary | ICD-10-CM | POA: Diagnosis not present

## 2024-10-09 DIAGNOSIS — F322 Major depressive disorder, single episode, severe without psychotic features: Secondary | ICD-10-CM | POA: Insufficient documentation

## 2024-10-09 DIAGNOSIS — Z3491 Encounter for supervision of normal pregnancy, unspecified, first trimester: Secondary | ICD-10-CM

## 2024-10-09 DIAGNOSIS — R45851 Suicidal ideations: Secondary | ICD-10-CM

## 2024-10-09 LAB — URINE DRUG SCREEN
Amphetamines: NEGATIVE
Barbiturates: NEGATIVE
Benzodiazepines: NEGATIVE
Cocaine: NEGATIVE
Fentanyl: NEGATIVE
Methadone Scn, Ur: NEGATIVE
Opiates: NEGATIVE
Tetrahydrocannabinol: NEGATIVE

## 2024-10-09 LAB — COMPREHENSIVE METABOLIC PANEL WITH GFR
ALT: 13 U/L (ref 0–44)
AST: 18 U/L (ref 15–41)
Albumin: 3.9 g/dL (ref 3.5–5.0)
Alkaline Phosphatase: 53 U/L (ref 38–126)
Anion gap: 10 (ref 5–15)
BUN: 13 mg/dL (ref 6–20)
CO2: 21 mmol/L — ABNORMAL LOW (ref 22–32)
Calcium: 9.2 mg/dL (ref 8.9–10.3)
Chloride: 106 mmol/L (ref 98–111)
Creatinine, Ser: 0.59 mg/dL (ref 0.44–1.00)
GFR, Estimated: >60 mL/min (ref >60)
Glucose, Bld: 84 mg/dL (ref 70–99)
Potassium: 3.9 mmol/L (ref 3.5–5.1)
Sodium: 136 mmol/L (ref 135–145)
Total Bilirubin: 0.4 mg/dL (ref 0.0–1.2)
Total Protein: 7.2 g/dL (ref 6.5–8.1)

## 2024-10-09 LAB — ETHANOL: Alcohol, Ethyl (B): <15 mg/dL (ref <15)

## 2024-10-09 LAB — CBC WITH DIFFERENTIAL/PLATELET
Abs Immature Granulocytes: 0.02 K/uL (ref 0.00–0.07)
Basophils Absolute: 0 K/uL (ref 0.0–0.1)
Basophils Relative: 1 %
Eosinophils Absolute: 0.1 K/uL (ref 0.0–0.5)
Eosinophils Relative: 1 %
HCT: 37.4 % (ref 36.0–46.0)
Hemoglobin: 11.7 g/dL — ABNORMAL LOW (ref 12.0–15.0)
Immature Granulocytes: 0 %
Lymphocytes Relative: 18 %
Lymphs Abs: 1.5 K/uL (ref 0.7–4.0)
MCH: 24.8 pg — ABNORMAL LOW (ref 26.0–34.0)
MCHC: 31.3 g/dL (ref 30.0–36.0)
MCV: 79.4 fL — ABNORMAL LOW (ref 80.0–100.0)
Monocytes Absolute: 0.8 K/uL (ref 0.1–1.0)
Monocytes Relative: 10 %
Neutro Abs: 6 K/uL (ref 1.7–7.7)
Neutrophils Relative %: 70 %
Platelets: 284 K/uL (ref 150–400)
RBC: 4.71 MIL/uL (ref 3.87–5.11)
RDW: 15.9 % — ABNORMAL HIGH (ref 11.5–15.5)
WBC: 8.5 K/uL (ref 4.0–10.5)
nRBC: 0 % (ref 0.0–0.2)

## 2024-10-09 LAB — HCG, SERUM, QUALITATIVE: Preg, Serum: POSITIVE — AB

## 2024-10-09 LAB — HCG, QUANTITATIVE, PREGNANCY: hCG, Beta Chain, Quant, S: 148505 m[IU]/mL — ABNORMAL HIGH (ref <5)

## 2024-10-09 MED ORDER — ACETAMINOPHEN 325 MG PO TABS: 650.0000 mg | ORAL_TABLET | ORAL | Status: DC | PRN

## 2024-10-09 NOTE — ED Notes (Signed)
 [facility] reports pt can arrive after next shift after I attempted to call report, will notify oncoming shift.

## 2024-10-09 NOTE — ED Notes (Signed)
 Voluntary consent signed, witnessed by me, faxed to [facility]

## 2024-10-09 NOTE — Telephone Encounter (Signed)
 At approximately 11:24 AM, patient Melissa [name] called the office crying hysterically and appeared to be hyperventilating. I attempted to calm her down for several minutes in order to better understand her situation and confirm her location.  I asked [name] if she was in a safe location and if it was acceptable for me to contact EMS to transport her to [facility]. She stated that she was at [facility], located at [address], [place], [place] [address], and agreed for EMS to be called.  At 11:29 AM, York Blush contacted EMS, who confirmed that a unit would be dispatched to the location to check on the patient. I remained on the phone with [name] until EMS arrived.  At 11:41 AM, EMS and Officer Suzanne arrived on the scene. I spoke directly with Officer Suzanne to advocate for the patient and provided him with key details regarding the events of the day. I also informed him that I had previously spoken with [facility], who were expecting the patient's arrival.  After concluding the call with Officer Suzanne, I contacted [facility] again to notify them that [name] was en route via EMS. The urgent care secretary transferred me to Damien (AD), who informed me that the facility was currently deferring patients and had no available beds.  I asked Damien about the protocol for patients expressing active suicidal ideation and intent to harm themselves or their unborn child when no beds are available. Damien stated that there were no available beds and that the wait time would be lengthy.  Following that conversation, I contacted Marton Sharps at 11:54 AM and provided a full update regarding the situation.  I will follow up and check on the patient before the end of my shift today (10/09/2024).

## 2024-10-09 NOTE — Consult Note (Signed)
 [facility] Psychiatric Consult Initial  Patient Name: .Melissa [name]  MRN: [id number]  DOB: June 01, 2001  Consult Order details:  Orders (From admission, onward)     Start     Ordered   10/09/24 1418  CONSULT TO CALL ACT TEAM       Ordering Provider: Elnor Jayson LABOR, DO  Provider:  (Not yet assigned)  Question:  Reason for Consult?  Answer:  si w/ plan, pregnant   10/09/24 1417             Mode of Visit: In person    Psychiatry Consult Evaluation  Service Date: October 09, 2024 LOS:  LOS: 0 days  Chief Complaint "I have a lot going on and I'm thinking about suicide."  Primary Psychiatric Diagnoses  Major Depressive Disorder, Severe, without Psychotic Features Suicidal Ideation  Assessment  Melissa [name] is a 22 y.o. female admitted: Presented to the EDfor 10/09/2024  1:00 PM for Suicidal ideation with plan; [redacted] weeks pregnant. She carries the psychiatric diagnoses of none and has a past medical history of  patient is currently pregnant.   23 year old pregnant female presenting with active suicidal ideation with a plan, worsening depressive symptoms, sleep disturbance, feelings of hopelessness, and no support. She is guarded, minimally cooperative, and unable to safety plan. She has no outpatient mental health support and is not on medications. Triggered by recent breakup and overwhelmed by pregnancy and parenting stress.  Given pregnancy, lack of supports, active SI with plan, refusal of collateral, and impaired judgment, she is not safe for discharge and requires inpatient psychiatric hospitalization for safety, stabilization, and initiation of treatment. IVC is recommended if patient attempts to leave. Please see plan below for detailed recommendations.   Diagnoses:  Active Hospital problems: Principal Problem:   MDD (major depressive disorder)    Plan   ## Psychiatric Medication Recommendations:  Will not start any medications while here in the emergency  department  ## Medical Decision Making Capacity: Not specifically addressed in this encounter  ## Further Work-up:  -- No further workup needed at this time EKG or UDS -- most recent EKG on 10/09/2024 had QtC of 364 -- Pertinent labwork reviewed earlier this admission includes: CBC, CMP, EKG, UDS   ## Disposition:-- We recommend inpatient psychiatric hospitalization when medically cleared. Patient is under voluntary admission status at this time; please IVC if attempts to leave hospital.  ## Behavioral / Environmental: -Difficult Patient (SELECT OPTIONS FROM BELOW), To minimize splitting of staff, assign one staff person to communicate all information from the team when feasible., or Utilize compassion and acknowledge the patient's experiences while setting clear and realistic expectations for care.    ## Safety and Observation Level:  - Based on my clinical evaluation, I estimate the patient to be at high risk of self harm in the current setting. - At this time, we recommend  1:1 Observation. This decision is based on my review of the chart including patient's history and current presentation, interview of the patient, mental status examination, and consideration of suicide risk including evaluating suicidal ideation, plan, intent, suicidal or self-harm behaviors, risk factors, and protective factors. This judgment is based on our ability to directly address suicide risk, implement suicide prevention strategies, and develop a safety plan while the patient is in the clinical setting. Please contact our team if there is a concern that risk level has changed.  CSSR Risk Category:C-SSRS RISK CATEGORY: High Risk  Suicide Risk Assessment: Patient has following modifiable risk  factors for suicide: active suicidal ideation, untreated depression, and triggering events, which we are addressing by recommending inpatient psychiatric admission. Patient has following non-modifiable or demographic risk  factors for suicide: separation or divorce Patient has the following protective factors against suicide: Minor children in the home  Thank you for this consult request. Recommendations have been communicated to the primary team.  We will continue to follow patient at this time.   CATHALEEN ADAM, PMHNP       History of Present Illness  Relevant Aspects of Hospital ED Course:  Admitted on 10/09/2024 for Suicidal ideation with plan; [redacted] weeks pregnant.  Patient Report:  23 year old female who is currently [redacted] weeks pregnant presents to the ED with active suicidal ideation. Patient reports worsening depression and states she is thinking about overdosing on pills. She reports she does not currently have access to medication or know anyone with pills, but endorses intent. She describes this as the worst depressive episode she has ever experienced, stating she feels overwhelmed and unsupported with her pregnancy and caring for her 19-year-old son.  During evaluation, patient was guarded, irritable, and minimally engaged. Provider had to repeatedly prompt for responses as patient frequently replied "I'm tired" and "I don't want to talk." She reports she has been crying all night and has had no sleep. She states she feels hopeless, exhausted, and unsupported.  Patient reports she lives with her 31-year-old son and works at [employer]. She states her son is currently with his father. She identifies limited support--mother deceased, limited connection with siblings, and only "sometimes close" with one brother. She reports no family support and no one to help her. She is not connected with WIC or supportive services.  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 She denies HI/AVH. She denies any prior psychiatric diagnosis, past psychiatric admissions, or history of psychiatric medications. She denies substance use; UDS negative and BAL < 15.  She later  disclosed that the triggering event leading to today's crisis was her breakup with the father of her unborn baby, who told her he no longer wants to be with her. Patient reports this significantly worsened her emotional state and contributed to her suicidal thoughts. Patient called [facility] OB clinic ([facility]) earlier today and informed them she was having active SI with intent.  PHQ-9 score today: 15 (moderately severe depression).  Patient denies consent for collateral contact. States she does not want inpatient admission, but due to active SI with plan, lack of supports, pregnancy, and recent escalation, IVC is recommended if she attempts to leave.  Psych ROS:  Depression: Positive Anxiety: Positive Mania (lifetime and current): Denies Psychosis: (lifetime and current): Denies   Review of Systems  Psychiatric/Behavioral:  Positive for depression and suicidal ideas.      Psychiatric and Social History  Psychiatric History:  Information collected from patient  Prev Dx/Sx: None Current Psych Provider: Denies Home Meds (current): Denies Previous Med Trials: Denies Therapy: Denies  Prior Psych Hospitalization: Denies  Prior Self Harm: Denies Prior Violence: Denies  Family Psych History: Denies Family Hx suicide: Denies  Social History:  Developmental Hx: Deferred Educational Hx: Patient graduated high school Occupational Hx: Employed at [employer] Hx: Denies Living Situation: Lives alone with 98-year-old son Spiritual Hx: Yes Access to weapons/lethal means: Denies   Substance History Patient denies any past or current substance abuse issues.  UDS is negative for substances and blood alcohol  level is negative.  Exam Findings  Physical Exam:  Vital Signs:  Temp:  [98.5 F (36.9 C)] 98.5 F (36.9 C) (11/04 1241) Pulse Rate:  [105] 105 (11/04 1241) Resp:  [15] 15 (11/04 1241) BP: (110)/(77) 110/77 (11/04 1241) SpO2:  [100 %]  100 % (11/04 1241) Weight:  [48.5 kg] 48.5 kg (11/04 1311) Blood pressure 110/77, pulse (!) 105, temperature 98.5 F (36.9 C), temperature source Oral, resp. rate 15, height 5' 1 (1.549 m), weight 48.5 kg, last menstrual period 07/21/2024, SpO2 100%, currently breastfeeding. Body mass index is 20.22 kg/m.  Physical Exam Vitals and nursing note reviewed. Exam conducted with a chaperone present.  Constitutional:      Appearance: Normal appearance.  Neurological:     Mental Status: She is alert.  Psychiatric:        Attention and Perception: Attention normal.        Mood and Affect: Mood is depressed. Affect is flat.        Speech: Speech normal.        Behavior: Behavior is withdrawn. Behavior is cooperative.        Thought Content: Thought content includes suicidal ideation.        Judgment: Judgment is inappropriate.     Mental Status Exam: General Appearance: Tired, withdrawn, minimal eye contact  Orientation:  Full (Time, Place, and Person)  Memory:  Immediate;   Fair Recent;   Fair  Concentration:  Concentration: Fair  Recall:  Fair  Attention  Fair  Eye Contact:  Poor  Speech:  Clear and Coherent  Language:  Fair  Volume:  Decreased  Mood: "Tired and overwhelmed"  Affect:  Depressed, constricted  Thought Process: Linear but slowed, limited elaboration  Thought Content:  Active SI with plan to overdose; denies HI/AVH  Suicidal Thoughts:  Yes.  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  Family History:  The patient's family history is not on file.  Medical  History: Past Medical History:  Diagnosis Date   Medical history non-contributory    Seasonal allergies     Surgical History: Past Surgical History:  Procedure Laterality Date   NO PAST SURGERIES       Medications:   Current Facility-Administered Medications:    acetaminophen  (TYLENOL ) tablet 650 mg, 650 mg, Oral, Q4H PRN, Elnor Jayson LABOR, DO  Current Outpatient Medications:    acetaminophen  (TYLENOL ) 325 MG tablet, Take 2 tablets (650 mg total) by mouth every 4 (four) hours as needed (for pain scale < 4). (Patient not taking: Reported on 10/09/2024), Disp: , Rfl:    coconut oil OIL, Apply 1 application topically as needed. (Patient not taking: Reported on 10/09/2024), Disp: , Rfl: 0   ibuprofen  (ADVIL ) 600 MG tablet, Take 1 tablet (600 mg total) by mouth every 6 (six) hours. (Patient not taking: Reported on 10/09/2024), Disp: 30 tablet, Rfl: 0   Norethindrone  Acetate-Ethinyl Estrad-FE (LOESTRIN 24 FE) 1-20 MG-MCG(24) tablet, Take 1 tablet by mouth daily. (Patient not taking: Reported on 10/09/2024), Disp: 28 tablet, Rfl: 11   ondansetron  (ZOFRAN -ODT)  4 MG disintegrating tablet, Take 1 tablet (4 mg total) by mouth every 6 (six) hours as needed for nausea. (Patient not taking: Reported on 10/09/2024), Disp: 30 tablet, Rfl: 4   promethazine (PHENERGAN) 25 MG tablet, Take 1 tablet (25 mg total) by mouth every 6 (six) hours as needed. (Patient not taking: Reported on 10/09/2024), Disp: 30 tablet, Rfl: 0   simethicone  (MYLICON) 80 MG chewable tablet, Chew 1 tablet (80 mg total) by mouth as needed for flatulence. (Patient not taking: Reported on 10/09/2024), Disp: 30 tablet, Rfl: 0  Allergies: No Known Allergies  Offie Waide MOTLEY-MANGRUM, PMHNP

## 2024-10-09 NOTE — ED Triage Notes (Signed)
 Pt to er, pt states that she has a lot going on and is thinking about suicide, states that her plan is to overdose on pills, states that she doesn't have any pills or know anyone with pills, pt states that she has been depressed before, but never this bad, pt states that she is also [redacted] weeks pregnant, denies cramping or bleeding, denies having supports.  Pd mental health with pt

## 2024-10-09 NOTE — ED Notes (Signed)
 Order for fetal heart tones discontinued by provider.

## 2024-10-09 NOTE — ED Notes (Signed)
 No fetal doppler available in department after searching all throughout department multiple times, charge RN has informed provider and they will look into available options.

## 2024-10-09 NOTE — Progress Notes (Signed)
 Pt has been accepted to [facility] on 10/09/2024 . Bed assignment:402-2   Pt meets inpatient criteria per Cathaleen Adam, NP   Attending Physician will be Dr. Raliegh    Report can be called to: - Adult unit: [phone]  Pt can arrive pending consents   Care Team Notified: [facility] Cherylynn Ernst, RN, Ester Sloop, Paramedic, Cathaleen Adam, NP

## 2024-10-09 NOTE — Telephone Encounter (Signed)
 At approximately 9:55 AM, Melissa [name] contacted the [facility] for [facility] in [place]. During the call, she expressed suicidal thoughts and stated that she also wished to terminate her pregnancy. Melissa [name] reported that she has not been eating and has been too tired to perform daily activities, including work.  We spoke for about 15 minutes, during which Melissa [name] permitted me to place her on hold so that I could obtain additional assistance. I contacted [facility] to consult with Cquadayshia Sherrine, Clinical Social Worker, and also spoke with Marton Sharps, [facility] Manager, to discuss the available support options for Melissa [name].  Melissa [name] provided the following resources for Melissa [name] [facility]: [address], [place], [place]  Contact number: [phone]  National Suicide Prevention Hotline: (563)380-7021  47 Suicide and Crisis Lifeline: Available for immediate assistance  I sent a secure message to both Marton Sharps and Marcelyn Sherrine to ensure ongoing coordination and continued follow-up for the patient.  [name] stated that she planned to go to the Behavioral Health Urgent Care today and expected to arrive within the hour. I informed her that I would call back at 11:00 AM to confirm that she had arrived safely at the facility.  Melissa [name], the provider in the office, granted me access to the dot phrases (listed below) to send to the patient as an additional line of support. Cquadayshia Sherrine also stated she would reach out to check on Melissa [name] directly. Additionally, I notified Melissa [name], Nurse Care Manager, for further follow-up and support.  24/7 Behavioral Health Crisis Centers by [place]:  [name] :  [place]: [facility] 24/7 Crisis Line: [phone] Access to Care Line [phone]  [facility]  [address] [place], [place] [phone] (call to confirm hours)  [facility]: [facility] 24/7 Crisis Line: [phone] Access to Care Line [phone] [facility]: [facility] Mental Health Outpatient: [phone]   [address] [place], [place] [address]  [facility]: [facility] Recovery Services (24/7): [phone]   [address] [place], [place] [address]  [facility] (24/7): [phone]   [address] [place], [place] [address] - 24 Hour Crisis Line: [phone]  [facility]: - [facility] Urgent Care (24/7): [phone]   [address] [place], [place] [address] - [place] 24 Hour Crisis Line: [phone]  [facility] (24/7): [phone] [address] [place]. [place], [place] [address] - 24 Hour Crisis Line: [phone]  [place]:  -[facility] Care [phone]  [place] :  [facility]: -24 Hour Crisis Line: [phone] Or Emergency room at [address], [place], [place] [address]  -Melissa [name] [facility] [phone] Non-emergency walk-in Monday-Fridays 8:30am-3:00pm, At [address] [place], [place] [address]  [facility]: [facility] [place]: [phone]  [address] , [address], [place], [place] [address] -Crisis Line: Business Hours- [phone] / After Hours-[phone]  [facility]: [employer]: [phone] [address] in [place], [place] [address] -Crisis Line: Business Hours- [phone] After Hours-[phone]  [facility]: [employer]: [phone]  [address] in [place], [place], [place] [address] -Crisis Line: Business Hours- [phone] / After Hours-[phone]   And Behavioral Health Resources:   What if I or someone I know is in crisis?  If you are thinking about harming yourself or having thoughts of  suicide, or if you know someone who is, seek help right away.  Call your doctor or mental health care provider.  Call 911 or go to a hospital emergency room to get immediate help, or ask a friend or family member to help you do these things; IF YOU ARE IN [place], YOU MAY GO TO WALK-IN URGENT CARE 24/7 at [facility] (see below)  Call the [place]  National Suicide Prevention Lifeline's toll-free, 24-hour hotline at 1-800-273-TALK 8023567012) or TTY: 1-800-799-4 TTY (306)736-2128) to talk to a trained counselor.  If you are in crisis, make sure you are not left alone.   If someone else is in crisis, make sure he or she is not left alone   24 Hour :   [facility]  [address] [place], [place] [address] [phone] or [phone] WALK-IN URGENT CARE 24/7  [facility] Mobile Crisis: [phone]  [place]  National Suicide Hotline: 715-175-6596  [facility] of the [employer] (Domestic Violence, Rape & Victim Assistance)  [phone]  [facility] [place] [facility]  [address], [place]  [address]   [phone] or 2494157660   [facility] [employer] Crisis Services: [phone] (8am-4pm) or [phone] (after hours)        [facility], [address] [place], [place]  [phone] Fax: [phone] [email or web address] *Interpreters available *Accepts all insurance and uninsured for Urgent Care needs *Accepts Medicaid and uninsured for outpatient treatment   [facility]   Mon-Fri: 8am-5pm [address], [place], [place] [phone](eynwz); [phone](fax) [email or web address]  *Accepts Medicare  [facility] Pablo Earlean [place], Fri: 8am-4pm [address], [place], [place] [address] [phone] (phone); [phone]  (fax) [email or web address]  *Accepts Medicare  [facility] Mon-Fri: 9am-5pm  [address] [place], [place] [phone] (phone); [phone]  [email or web address]  *Accepts Medicaid  [facility] [place], 8:30am-12pm/1pm-2:30pm [address] [place], [place] [phone] (phone); [phone] (fax) www.[email or web address]  *Accepts Medicaid, sliding-scale*Bilingual services available  [facility] Mon-Fri, 8am-7pm [address], [place], [place]  [phone](eynwz); [phone](fax) www.[email or web address]  *Accepts Medicaid *Bilingual services available  [facility] Mon-Fri: 8am-5pm, Saturday by appointment only [address] [place], [place], [place] [phone] (phone); [phone] (fax) [email or web address]   [facility] [address], [place], [place] [phone] [email or web address]  *Free & reduced services for uninsured and underinsured individuals *Bilingual services for Spanish-speaking clients 21 and under  [facility], [address], [place], [place] [phone](eynwz); [phone](fax) [email or web address]  *Bring your own interpreter at first visit *Accepts Medicare and [facility] Mon-Fri: 9am-5:30pm [address], [place], [place] [phone] (phone), [phone] (fax) After hours crisis line: [phone] [email or web address]  *Accepts Medicare and Medicaid  [employer], 8am-6pm [address], [place], [place]  [phone] (phone); [phone] (fax) http://[email or web address]  *Subsidized costs available  [facility] Mon-Fri: 8am-4pm [address], [place], [place] [phone](eynwz);  [phone](fax) [email or web address].[email or web address]  *Accepts Medicaid  [facility] Same day access hours: Mon-Fri, 8:30-3pm Crisis hours: Mon-Fri, 8am-5pm [address] [place], [place] [phone]  [facility] [employer] Same day access hours: Mon-Fri, 8:30-3pm Crisis hours: Mon-Fri, 8am-8pm [address], [place], [place] [phone] (phone); [phone] (fax) www.[email or web address]  *Accepts Medicaid and Medicare  [facility] [place], [place], Fri: 9am-9pm Tues, Thurs: 9am-6pm [address] [place], [place],  [place]  [phone] (phone); [phone] (fax) [email or web address]  *(Accepts Medicare and Medicaid; payment plans available)*Bilingual services available  [facility] [address], [place], [place] [phone] (phone); [phone] (fax) www.[email or web address]   [facility] [address], [place], [place]  [phone]  [email or web address]  *Bilingual services available  [facility]otional [facility]) Mon-Thurs: 8am-8pm [address], [place], [place] [phone] (phone); [phone] (fax) [email or web address]  *Accepts Medicaid*Bilingual services available  [facility] [address] [place], [place] [phone] (phone) [email or web address]  *Accepts Medicaid *Bilingual services available  [facility] Mon-Fri, 9am-4:45pm [address] [place], [place] [phone] (phone); [phone] (fax) [email or web address]://[email or web address]  *Accepts Medicare  [facility] Mon-Thurs: 8:30-8pm, Fri: 8:30am-7pm [address], [place], [place] (3rd floor) [phone] (phone); [phone] (fax) [email or web address]  *Accepts Medicaid; income-based reduced rates available  [facility] Mon-Fri: 8am-5pm [address], [place], [place] [address] [phone] (phone);  [phone] (fax) [email or web address]://[email or web address]  *Accepts Medicaid*Bilingual services available   [facility] [place])  [address] [place] [phone] www.[email or web address]  *Provides direct services to individuals in recovery from mental illness, including support groups, recovery skills classes, and one on one peer support  [employer] on Mental Illness) [place] helpline: (725)380-6190  NAMI [place] helpline: 4755491504 https://[email or web address]  *A community hub for information relating to local resources and services for the friends and families of individuals living alongside a mental health condition, as well as the individuals themselves. Classes and support groups also provided

## 2024-10-09 NOTE — ED Provider Notes (Signed)
 [place] EMERGENCY DEPARTMENT AT [facility] Provider Note  CSN: [id number] Arrival date & time: 10/09/24 1229  Chief Complaint(s) Suicidal  HPI Melissa [name] is a 23 y.o. female with past medical history as below, significant for currently pregnant [redacted] weeks 3 days, G3 P1-0-1-1 who presents to the ED with complaint of suicidal  [facility], [facility] care   Patient here with suicidal ideation and plan.  She reports that she wants to kill herself, plan to overdose on pills. She has not taken any pills, denies any ingestion, any illicit substance today. No hallucinations or delusions.  Patient reports that her pregnancy is driving her suicidal ideation. She called OBGYN and verbalized her suicidal thoughts earlier today.  Denies any vaginal bleeding or abnormal discharge.  No change to her bowel or bladder function.  No vomiting no fevers.     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 Authorizing Provider  acetaminophen  (TYLENOL ) 325 MG tablet Take 2 tablets (650 mg total) by mouth every 4 (four) hours as needed (for pain scale < 4). Patient not taking: Reported on 10/09/2024 07/18/21   Edelmiro Mungo, MD  coconut oil OIL Apply 1 application topically as needed. Patient not taking: Reported on 10/09/2024 07/18/21   Edelmiro Mungo, MD  ibuprofen  (ADVIL ) 600 MG  tablet Take 1 tablet (600 mg total) by mouth every 6 (six) hours. Patient not taking: Reported on 10/09/2024 07/18/21   Edelmiro Mungo, MD  Norethindrone  Acetate-Ethinyl Estrad-FE (LOESTRIN 24 FE) 1-20 MG-MCG(24) tablet Take 1 tablet by mouth daily. Patient not taking: Reported on 10/09/2024 08/21/21   Teresia Lani SAILOR, CNM  ondansetron  (ZOFRAN -ODT) 4 MG disintegrating tablet Take 1 tablet (4 mg total) by mouth every 6 (six) hours as needed for nausea. Patient not taking: Reported on 10/09/2024 10/04/24   Stinson, Jacob J, DO  promethazine (PHENERGAN) 25 MG tablet Take 1 tablet (25 mg total) by mouth every 6 (six) hours as needed. Patient not taking: Reported on 10/09/2024 09/17/24   Eveline Lynwood MATSU, MD  simethicone  [facility]) 80 MG chewable tablet Chew 1 tablet (80 mg total) by mouth as needed for flatulence. Patient not taking: Reported on 10/09/2024 07/18/21   Edelmiro Mungo, MD  Past Surgical History Past Surgical History:  Procedure Laterality Date   NO PAST SURGERIES     Family History History reviewed. No pertinent family history.  Social History Social History   Tobacco Use   Smoking status: Never   Smokeless tobacco: Never  Vaping Use   Vaping status: Never Used  Substance Use Topics   Alcohol use: No   Drug use: No   Allergies Patient has no known allergies.  Review of Systems A thorough review of systems was obtained and all systems are negative except as noted in the HPI and PMH.   Physical Exam Vital Signs  I have reviewed the triage vital signs BP 110/77 (BP Location: Left Arm)   Pulse (!) 105   Temp 98.5 F (36.9 C) (Oral)   Resp 15   Ht 5' 1 (1.549 m)   Wt 48.5 kg   LMP 07/21/2024   SpO2 100%   BMI 20.22 kg/m  Physical Exam Vitals and nursing note reviewed.  Constitutional:      General: She is not in acute distress.     Appearance: Normal appearance. She is well-developed. She is not ill-appearing.  HENT:     Head: Normocephalic and atraumatic.     Right Ear: External ear normal.     Left Ear: External ear normal.     Nose: Nose normal.     Mouth/Throat:     Mouth: Mucous membranes are moist.  Eyes:     General: No scleral icterus.       Right eye: No discharge.        Left eye: No discharge.  Cardiovascular:     Rate and Rhythm: Normal rate.  Pulmonary:     Effort: Pulmonary effort is normal. No respiratory distress.     Breath sounds: No stridor.  Abdominal:     General: Abdomen is flat. There is no distension.     Tenderness: There is no guarding.  Musculoskeletal:        General: No deformity.     Cervical back: No rigidity.  Skin:    General: Skin is warm and dry.     Coloration: Skin is not cyanotic, jaundiced or pale.  Neurological:     Mental Status: She is alert.  Psychiatric:        Mood and Affect: Mood is depressed. Affect is flat.        Speech: She is noncommunicative.        Behavior: Behavior is slowed and withdrawn. Behavior is cooperative.        Thought Content: Thought content is not paranoid or delusional. Thought content includes suicidal ideation. Thought content does not include homicidal ideation. Thought content includes suicidal plan. Thought content does not include homicidal plan.     ED Results and Treatments Labs (all labs ordered are listed, but only abnormal results are displayed) Labs Reviewed  COMPREHENSIVE METABOLIC PANEL WITH GFR - Abnormal; Notable for the following components:      Result Value   CO2 21 (*)    All other components within normal limits  CBC WITH DIFFERENTIAL/PLATELET - Abnormal; Notable for the following components:   Hemoglobin 11.7 (*)    MCV 79.4 (*)    MCH 24.8 (*)    RDW 15.9 (*)    All other components within normal limits  HCG, SERUM, QUALITATIVE - Abnormal; Notable for the following components:   Preg, Serum POSITIVE  (*)    All other components within normal limits  HCG, QUANTITATIVE, PREGNANCY - Abnormal; Notable for the following components:   hCG, Beta Chain, Quant, S 148,505 (*)    All other components within normal limits  ETHANOL  URINE DRUG SCREEN                                                                                                                          Radiology No results found.  Pertinent labs & imaging results that were available during my care of the patient were reviewed by me and considered in my medical decision making (see MDM for details).  Medications Ordered in ED Medications  acetaminophen  (TYLENOL ) tablet 650 mg (has no administration in time range)                                                                                                                                     Procedures Procedures  (including critical care time)  Medical Decision Making / ED Course    Medical Decision Making:    Necie Siniya Lichty is a 23 y.o. female with past medical history as below, significant for currently pregnant [redacted] weeks 3 days, G3 P1-0-1-1 who presents to the ED with complaint of suicidal. The complaint involves an extensive differential diagnosis and also carries with it a high risk of complications and morbidity.  Serious etiology was considered.   Complete initial physical exam performed, notably the patient was in no acute distress.    Reviewed and confirmed nursing documentation for past medical history, family history, social history.  Vital signs reviewed.    Suicidal ideation with plan> - She is acutely suicidal, attributes suicidality due to her pregnancy.  Plan to overdose on pills but denies any actual ingestion substance today. - she is medically cleared, dispo pending TTS evaluation  First trimester pregnancy > - approx [redacted]w[redacted]d, Dr Barbra OBGYN - no associated preg complaints, no vaginal bleeding or discharge, no sig pelvic pain                       Additional history obtained: -Additional history obtained from na -External records from outside source obtained and reviewed including: Chart review including previous notes, labs, imaging, consultation notes including  Obgyn documentation   Lab Tests: -I ordered, reviewed, and interpreted labs.   The pertinent results include:   Labs Reviewed  COMPREHENSIVE METABOLIC PANEL WITH GFR - Abnormal; Notable  for the following components:      Result Value   CO2 21 (*)    All other components within normal limits  CBC WITH DIFFERENTIAL/PLATELET - Abnormal; Notable for the following components:   Hemoglobin 11.7 (*)    MCV 79.4 (*)    MCH 24.8 (*)    RDW 15.9 (*)    All other components within normal limits  HCG, SERUM, QUALITATIVE - Abnormal; Notable for the following components:   Preg, Serum POSITIVE (*)    All other components within normal limits  HCG, QUANTITATIVE, PREGNANCY - Abnormal; Notable for the following components:   hCG, Beta Chain, Quant, S 148,505 (*)    All other components within normal limits  ETHANOL  URINE DRUG SCREEN    Notable for labs stable, c/w gestational age   EKG   EKG Interpretation Date/Time:    Ventricular Rate:    PR Interval:    QRS Duration:    QT Interval:    QTC Calculation:   R Axis:      Text Interpretation:           Imaging Studies ordered: na   Medicines ordered and prescription drug management: Meds ordered this encounter  Medications   acetaminophen  (TYLENOL ) tablet 650 mg    -I have reviewed the patients home medicines and have made adjustments as needed   Consultations Obtained: I requested consultation with the TTS   Cardiac Monitoring: Continuous pulse oximetry interpreted by myself, 100% on RA.    Social Determinants of Health:  Diagnosis or treatment significantly limited by social determinants of health: pregnancy    Reevaluation: After the interventions noted  above, I reevaluated the patient and found that they have stayed the same  Co morbidities that complicate the patient evaluation  Past Medical History:  Diagnosis Date   Medical history non-contributory    Seasonal allergies       Dispostion: Disposition decision including need for hospitalization was considered, and patient disposition pending at time of sign out.    Final Clinical Impression(s) / ED Diagnoses Final diagnoses:  First trimester pregnancy  Suicidal ideations        Elnor Jayson LABOR, DO 10/09/24 1729

## 2024-10-09 NOTE — Telephone Encounter (Signed)
 Called the patient 4 times starting at 11:08 until 11:20. I left a voicemail at 11:15 for the patient to give me a call with an update.  Shawnee Fleet, CMA

## 2024-10-10 ENCOUNTER — Inpatient Hospital Stay (HOSPITAL_COMMUNITY)
Admission: AD | Admit: 2024-10-10 | Discharge: 2024-10-14 | DRG: 832 | Disposition: A | Discharge status: Home / Self Care | Source: Intra-hospital | Attending: Student in an Organized Health Care Education/Training Program | Admitting: Student in an Organized Health Care Education/Training Program

## 2024-10-10 ENCOUNTER — Encounter (HOSPITAL_COMMUNITY): Payer: Self-pay | Admitting: Psychiatry

## 2024-10-10 ENCOUNTER — Encounter (HOSPITAL_COMMUNITY): Payer: Self-pay

## 2024-10-10 DIAGNOSIS — F332 Major depressive disorder, recurrent severe without psychotic features: Secondary | ICD-10-CM | POA: Diagnosis present

## 2024-10-10 DIAGNOSIS — O99341 Other mental disorders complicating pregnancy, first trimester: Principal | ICD-10-CM | POA: Diagnosis present

## 2024-10-10 DIAGNOSIS — Z59869 Financial insecurity, unspecified: Secondary | ICD-10-CM

## 2024-10-10 DIAGNOSIS — F329 Major depressive disorder, single episode, unspecified: Secondary | ICD-10-CM | POA: Diagnosis not present

## 2024-10-10 DIAGNOSIS — O361110 Maternal care for Anti-A sensitization, first trimester, not applicable or unspecified: Secondary | ICD-10-CM | POA: Diagnosis present

## 2024-10-10 DIAGNOSIS — Z3A11 11 weeks gestation of pregnancy: Secondary | ICD-10-CM | POA: Diagnosis not present

## 2024-10-10 DIAGNOSIS — Z79899 Other long term (current) drug therapy: Secondary | ICD-10-CM | POA: Diagnosis not present

## 2024-10-10 DIAGNOSIS — O99281 Endocrine, nutritional and metabolic diseases complicating pregnancy, first trimester: Secondary | ICD-10-CM | POA: Diagnosis present

## 2024-10-10 DIAGNOSIS — F17210 Nicotine dependence, cigarettes, uncomplicated: Secondary | ICD-10-CM | POA: Diagnosis present

## 2024-10-10 DIAGNOSIS — R45851 Suicidal ideations: Secondary | ICD-10-CM | POA: Diagnosis present

## 2024-10-10 DIAGNOSIS — Z716 Tobacco abuse counseling: Secondary | ICD-10-CM | POA: Diagnosis not present

## 2024-10-10 DIAGNOSIS — O99331 Smoking (tobacco) complicating pregnancy, first trimester: Secondary | ICD-10-CM | POA: Diagnosis present

## 2024-10-10 DIAGNOSIS — E559 Vitamin D deficiency, unspecified: Secondary | ICD-10-CM | POA: Diagnosis present

## 2024-10-10 LAB — FOLATE: Folate: 12.8 ng/mL (ref >5.9)

## 2024-10-10 LAB — VITAMIN B12: Vitamin B-12: 371 pg/mL (ref 180–914)

## 2024-10-10 LAB — TSH: TSH: 0.363 u[IU]/mL (ref 0.350–4.500)

## 2024-10-10 LAB — FERRITIN: Ferritin: 18 ng/mL (ref 11–307)

## 2024-10-10 MED ORDER — ACETAMINOPHEN 325 MG PO TABS: 650.0000 mg | ORAL_TABLET | Freq: Four times a day (QID) | ORAL | Status: DC | PRN

## 2024-10-10 MED ORDER — ALUM & MAG HYDROXIDE-SIMETH 200-200-20 MG/5ML PO SUSP: 30.0000 mL | ORAL | Status: DC | PRN

## 2024-10-10 MED ORDER — MAGNESIUM HYDROXIDE 400 MG/5ML PO SUSP: 30.0000 mL | Freq: Every day | ORAL | Status: DC | PRN

## 2024-10-10 MED ORDER — COMPLETENATE 29-1 MG PO CHEW
1.0000 | CHEWABLE_TABLET | Freq: Every day | ORAL | Status: DC
  Filled 2024-10-10: qty 1

## 2024-10-10 NOTE — [facility] Suicide Risk Assessment (Signed)
 Suicide Risk Assessment  Admission Assessment    [facility] Admission Suicide Risk Assessment   Nursing information obtained from:  Patient Demographic factors:  Living alone Current Mental Status:  Self-harm thoughts, Self-harm behaviors Loss Factors:  Loss of significant relationship Historical Factors:  Impulsivity Risk Reduction Factors:  Employed  Total Time spent with patient: 30 minutes Principal Problem: MDD (major depressive disorder) Diagnosis:  Principal Problem:   MDD (major depressive disorder)  Subjective Data:  Reason for Admission:  Melissa [name] is a 23 year old female with no significant psychiatric history but with recent medical history significant for intentional pregnancy at [redacted]w[redacted]d who is admitted to the behavioral health unit for worsening depression symptoms and suicidal ideation.   History of Present Illness:  Patient has been experiencing gradually worsening symptoms of depression for approximately the last year, but with sharp worsening since since the doctor visit on 08/29/24 revealed a Kells antigen autoimmune antibodies and the loss of her relationship with her baby's father (her boyfriend of 2 years) last week.    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   She has reported SI with a plan to kill herself to several previous providers, but would not share her plan to commit suicide during this initial interview.  Continued Clinical Symptoms:  Alcohol Use Disorder Identification Test Final Score (AUDIT): 0 The Alcohol Use Disorders Identification Test, Guidelines for Use in Primary Care, Second Edition.  World Science Writer [facility]). Score between 0-7:  no or low risk or alcohol related problems. Score between 8-15:  moderate risk of alcohol  related problems. Score between 16-19:  high risk of alcohol related problems. Score 20 or above:  warrants further diagnostic evaluation for alcohol dependence and treatment.   CLINICAL FACTORS:   Depression:   Anhedonia Hopelessness Impulsivity Insomnia Severe Medical Diagnoses and Treatments/Surgeries   Musculoskeletal: Strength & Muscle Tone: within normal limits Gait & Station: normal Patient leans: N/A       Psychiatric Specialty Exam: Mental Status Exam:   Appearance: Thin, tired appearing african american female with hair that is long and unbound.   Behavior: Withdrawn, guarded, depressed  Attitude: Cooperative but minimally interactive  Speech: Slow, sparse, quiet  Mood: Depressed  Affect: Congruent. Depressed, flat.  Thought Process: clear, coherent, linear  Thought Content: denies paranoia  SI/HI: Endorses SI but will not share plan  Perceptions: denies hallucinations, not seen responding  Judgement: Fair  Insight: Fair  Fund of Knowledge: WNL      Sleep  Sleep:No data recorded   Physical Exam: Vitals and nursing note reviewed.  HENT:     Head: Normocephalic and atraumatic.     Nose: Nose normal.  Pulmonary:     Effort: Pulmonary effort is normal.  Neurological:     General: No focal deficit present.     Mental Status: She is alert and oriented to person, place, and time.  Psychiatric:        Attention and Perception: Attention and perception normal.        Mood and Affect: Mood is depressed. Affect is flat.        Speech: Speech normal.        Behavior: Behavior is withdrawn. Behavior is cooperative.        Thought Content: Thought content is not paranoid  or delusional. Thought content includes suicidal ideation. Thought content does not include homicidal ideation. Thought content includes suicidal plan.        Cognition and Memory: Cognition and memory normal.        Judgment: Judgment is impulsive.     Review of Systems  Constitutional:   Positive for malaise/fatigue and weight loss. Negative for chills and fever.  Respiratory:  Negative for cough.   Cardiovascular:  Negative for chest pain.  Neurological:  Positive for headaches.  Psychiatric/Behavioral:  Positive for depression and suicidal ideas. Negative for hallucinations, memory loss and substance abuse. The patient is nervous/anxious and has insomnia.    Blood pressure 114/83, pulse 80, temperature 98.5 F (36.9 C), temperature source Oral, resp. rate 16, height 5' 1 (1.549 m), weight 45.4 kg, last menstrual period 07/21/2024, SpO2 100%, currently breastfeeding. Body mass index is 18.89 kg/m.   COGNITIVE FEATURES THAT CONTRIBUTE TO RISK:  Thought constriction (tunnel vision)    SUICIDE RISK:   Severe:  Frequent, intense, and enduring suicidal ideation, specific plan, no subjective intent, but some objective markers of intent (i.e., choice of lethal method), the method is accessible, some limited preparatory behavior, evidence of impaired self-control, severe dysphoria/symptomatology, multiple risk factors present, and few if any protective factors, particularly a lack of social support.  PLAN OF CARE: See H&P  I certify that inpatient services furnished can reasonably be expected to improve the patient's condition.   Lynwood Morene Lavone Delsie, MD 10/10/2024, 8:06 AM

## 2024-10-10 NOTE — [facility] Group Notes (Signed)
Patient did not attend the Pharmacy group.

## 2024-10-10 NOTE — Group Note (Signed)
 Recreation Therapy Group Note   Group Topic:Communication  Group Date: 10/10/2024 Start Time: 0940 End Time: 1025 Facilitators: Tammey Deeg-McCall, LRT,CTRS Location: [address] Day[address]   Group Topic: Communication, Problem Solving   Goal Area(s) Addresses:  Patient will effectively listen to complete activity.  Patient will identify communication skills used to make activity successful.  Patient will identify how skills used during activity can be used to reach post d/c goals.    Behavioral Response:    Intervention: Building Surveyor Activity - Geometric pattern cards, pencils, blank paper    Activity: Geometric Drawings.  Three volunteers from the peer group will be shown an abstract picture with a particular arrangement of geometrical shapes.  Each round, one 'speaker' will describe the pattern, as accurately as possible without revealing the image to the group.  The remaining group members will listen and draw the picture to reflect how it is described to them. Patients with the role of 'listener' cannot ask clarifying questions but, may request that the speaker repeat a direction. Once the drawings are complete, the presenter will show the rest of the group the picture and compare how close each person came to drawing the picture. LRT will facilitate a post-activity discussion regarding effective communication and the importance of planning, listening, and asking for clarification in daily interactions with others.  Education: Environmental consultant, Active listening, Support systems, Discharge planning  Education Outcome: Acknowledges understanding/In group clarification offered/Needs additional education.    Affect/Mood: N/A   Participation Level: Did not attend    Clinical Observations/Individualized Feedback:      Plan: Continue to engage patient in RT group sessions 2-3x/week.   Donalda Job-McCall, LRT,CTRS  10/10/2024 1:11 PM

## 2024-10-10 NOTE — H&P (Signed)
 Psychiatric Admission Assessment Adult  Patient Identification: [name] MRN:  [id number] Date of Evaluation:  10/10/2024 Chief Complaint:  MDD (major depressive disorder) [F32.9] Principal Diagnosis: MDD (major depressive disorder) Diagnosis:  Principal Problem:   MDD (major depressive disorder)  Reason for Admission:  Melissa [name] is a 23 year old female with no significant psychiatric history but with recent medical history significant for intentional pregnancy at [redacted]w[redacted]d who is admitted to the behavioral health unit for worsening depression symptoms and suicidal ideation.  History of Present Illness:  Patient has been experiencing gradually worsening symptoms of depression for approximately the last year, but with sharp worsening since since the doctor visit on 08/29/24 revealed a Kells antigen autoimmune antibodies and the loss of her relationship with her baby's father (her boyfriend of 2 years) last week.   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  She has reported SI with a plan to kill herself to several previous providers, but would not share her plan to commit suicide during this initial interview.  Psych ROS as below.  Associated Signs/Symptoms: Depression Symptoms:  depressed mood, anhedonia, insomnia, fatigue, feelings of worthlessness/guilt, difficulty concentrating, recurrent thoughts of death, suicidal thoughts with specific plan, anxiety, loss of energy/fatigue, weight loss, decreased appetite, (Hypo) Manic Symptoms:  none Anxiety Symptoms:  inability to stop worrying about many different things, no panic attacks, no muscle tension, no paresthesias. Psychotic Symptoms:  denies PTSD Symptoms: Negative Total Time spent with patient: 30  minutes  Past Psychiatric History:  Past Psychiatric Hx: Previous Psych Diagnoses: None Prior inpatient treatment: None Current/prior outpatient treatment: saw a school counselor when younger, no formal dx Prior rehab hx: denies Psychotherapy hx:no History of suicide: no History of homicide or aggression: None Psychiatric medication history: None Psychiatric medication compliance history: N/A Neuromodulation history: None Current Psychiatrist: None Current therapist: None  Substance Abuse Hx: Alcohol: No  Tobacco: no history Illicit drugs:  Marijuana/THC: used to smoke, quit 4 years ago prior to pregnancy  Other: no hx of other drugs  Past Medical History: Medical Diagnoses: 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 Adopted by Aunt/uncle at age 48. Graduated high school in [place]. No school afterwards. Grades were good, no IEP. No behavioral issues. Abuse: Patient denies, but avoids eye contact on this question Marital Status: single, never married. Recent relationship breakup Sexual orientation: prefers men Children: 66 year old son Melissa [name], currently with his father Employment: Print production planner at [employer] Peer Group: Denies close friends Housing: Has her own apartment Finances: strained Legal: no legal issues or pending court cases Military: no affiliation   Is the patient at risk to self? Yes.    Has the  patient been a risk to self in the past 6 months? No.  Has the patient been a risk to self within the distant past? No.  Is the patient  a risk to others? No.  Has the patient been a risk to others in the past 6 months? No.  Has the patient been a risk to others within the distant past? No.   Columbia Scale:  Flowsheet Row [facility]) [facility]09/2024 in [facility] Admission (Discharged) from 07/15/2021 in [facility] Mother Baby Unit  C-SSRS RISK CATEGORY High Risk High Risk No Risk     Prior Inpatient Therapy: No.  Prior Outpatient Therapy: No  Alcohol Screening: 1. How often do you have a drink containing alcohol?: Never 2. How many drinks containing alcohol do you have on a typical day when you are drinking?: 1 or 2 3. How often do you have six or more drinks on one occasion?: Never AUDIT-C Score: 0 4. How often during the last year have you found that you were not able to stop drinking once you had started?: Never 5. How often during the last year have you failed to do what was normally expected from you because of drinking?: Never 6. How often during the last year have you needed a first drink in the morning to get yourself going after a heavy drinking session?: Never 7. How often during the last year have you had a feeling of guilt of remorse after drinking?: Never 8. How often during the last year have you been unable to remember what happened the night before because you had been drinking?: Never 9. Have you or someone else been injured as a result of your drinking?: No 10. Has a relative or friend or a doctor or another health worker been concerned about your drinking or suggested you cut down?: No Alcohol Use Disorder Identification Test Final Score (AUDIT): 0 Substance Abuse History in the last 12 months:  No. Consequences of Substance Abuse: NA Previous  Psychotropic Medications: No  Psychological Evaluations: No  Past Medical History:  Past Medical History:  Diagnosis Date   Medical history non-contributory    Seasonal allergies     Past Surgical History:  Procedure Laterality Date   NO PAST SURGERIES     Family History: History reviewed. No pertinent family history. Family Psychiatric  History: unknown Tobacco Screening:  Social History   Tobacco Use  Smoking Status Never  Smokeless Tobacco Never    BH Tobacco Counseling     Are you interested in Tobacco Cessation Medications?  No value filed. Counseled patient on smoking cessation:  No value filed. Reason Tobacco Screening Not Completed: No value filed.       Social History:  Social History   Substance and Sexual Activity  Alcohol Use No     Social History   Substance and Sexual Activity  Drug Use No    Additional Social History:                           Allergies:  No Known Allergies Lab Results:  Results for orders placed or performed during the hospital encounter of 10/09/24 (from the past 48 hours)  Comprehensive metabolic panel     Status: Abnormal   Collection Time: 10/09/24  1:40 PM  Result Value Ref Range   Sodium 136 135 - 145 mmol/L   Potassium 3.9 3.5 - 5.1 mmol/L   Chloride 106 98 - 111 mmol/L   CO2 21 (L) 22 - 32 mmol/L   Glucose, Bld 84 70 -  99 mg/dL    Comment: Glucose reference range applies only to samples taken after fasting for at least 8 hours.   BUN 13 6 - 20 mg/dL   Creatinine, Ser 9.40 0.44 - 1.00 mg/dL   Calcium 9.2 8.9 - 89.6 mg/dL   Total Protein 7.2 6.5 - 8.1 g/dL   Albumin 3.9 3.5 - 5.0 g/dL   AST 18 15 - 41 U/L   ALT 13 0 - 44 U/L   Alkaline Phosphatase 53 38 - 126 U/L   Total Bilirubin 0.4 0.0 - 1.2 mg/dL   GFR, Estimated >39 >39 mL/min    Comment: (NOTE) Calculated using the CKD-EPI Creatinine Equation (2021)    Anion gap 10 5 - 15    Comment: Performed at [facility], [address], [place], [place] [address]  Ethanol     Status: None   Collection Time: 10/09/24  1:40 PM  Result Value Ref Range   Alcohol, Ethyl (B) <15 <15 mg/dL    Comment: (NOTE) For medical purposes only. Performed at [facility], [address] [place], [place] [address]   CBC with Diff     Status: Abnormal   Collection Time: 10/09/24  1:40 PM  Result Value Ref Range   WBC 8.5 4.0 - 10.5 K/uL   RBC 4.71 3.87 - 5.11 MIL/uL   Hemoglobin 11.7 (L) 12.0 - 15.0 g/dL   HCT 62.5 63.9 - 53.9 %   MCV 79.4 (L) 80.0 - 100.0 fL   MCH 24.8 (L) 26.0 - 34.0 pg   MCHC 31.3 30.0 - 36.0 g/dL   RDW 84.0 (H) 88.4 - 84.4 %   Platelets 284 150 - 400 K/uL   nRBC 0.0 0.0 - 0.2 %   Neutrophils Relative % 70 %   Neutro Abs 6.0 1.7 - 7.7 K/uL   Lymphocytes Relative 18 %   Lymphs Abs 1.5 0.7 - 4.0 K/uL   Monocytes Relative 10 %   Monocytes Absolute 0.8 0.1 - 1.0 K/uL   Eosinophils Relative 1 %   Eosinophils Absolute 0.1 0.0 - 0.5 K/uL   Basophils Relative 1 %   Basophils Absolute 0.0 0.0 - 0.1 K/uL   Immature Granulocytes 0 %   Abs Immature Granulocytes 0.02 0.00 - 0.07 K/uL    Comment: Performed at [facility], [address] [place], [place] [address]  hCG, serum, qualitative     Status: Abnormal   Collection Time: 10/09/24  1:40 PM  Result Value Ref Range   Preg, Serum POSITIVE (A) NEGATIVE    Comment:        THE SENSITIVITY OF THIS METHODOLOGY IS >10 mIU/mL. Performed at [facility], [address] [place], [place] [address]   hCG, quantitative, pregnancy     Status: Abnormal   Collection Time: 10/09/24  1:40 PM  Result Value Ref Range   hCG, Beta Chain, Quant, S 148,505 (H) <5 mIU/mL    Comment:          GEST. AGE      CONC.  (mIU/mL)   <=1 WEEK        5 - 50     2 WEEKS       50 - 500     3 WEEKS       100 - 10,000     4 WEEKS     1,000 - 30,000     5 WEEKS     3,500 -  115,000   6-8 WEEKS     12,000 - 270,000    12 WEEKS      15,000 - 220,000        FEMALE AND NON-PREGNANT FEMALE:     LESS THAN 5 mIU/mL Performed at [facility], [address], [place], [place] [address]   Urine rapid drug screen (hosp performed)     Status: None   Collection Time: 10/09/24  2:16 PM  Result Value Ref Range   Opiates NEGATIVE NEGATIVE   Cocaine NEGATIVE NEGATIVE   Benzodiazepines NEGATIVE NEGATIVE   Amphetamines NEGATIVE NEGATIVE   Tetrahydrocannabinol NEGATIVE NEGATIVE   Barbiturates NEGATIVE NEGATIVE   Methadone Scn, Ur NEGATIVE NEGATIVE   Fentanyl  NEGATIVE NEGATIVE    Comment: (NOTE) Drug screen is for Medical Purposes only. Positive results are preliminary only. If confirmation is needed, notify lab within 5 days.  Drug Class                 Cutoff (ng/mL) Amphetamine and metabolites 1000 Barbiturate and metabolites 200 Benzodiazepine              200 Opiates and metabolites     300 Cocaine and metabolites     300 THC                         50 Fentanyl                     5 Methadone                   300  Trazodone is metabolized in vivo to several metabolites,  including pharmacologically active m-CPP, which is excreted in the  urine.  Immunoassay screens for amphetamines and MDMA have potential  cross-reactivity with these compounds and may provide false positive  result.  Performed at [facility], [address], [place], [place] [address]     Blood Alcohol level:  Lab Results  Component Value Date   [facility] <15 10/09/2024    Metabolic Disorder Labs:  Lab Results  Component Value Date   HGBA1C 5.0 10/04/2024   No results found for: PROLACTIN No results found for: CHOL, TRIG, HDL, CHOLHDL, VLDL, LDLCALC  Current Medications: Current Facility-Administered Medications  Medication Dose Route Frequency Provider Last Rate Last Admin   acetaminophen  (TYLENOL ) tablet 650 mg  650 mg Oral Q6H PRN Motley-Mangrum, Jadeka A, PMHNP       alum & mag  hydroxide-simeth (MAALOX/MYLANTA) 200-200-20 MG/5ML suspension 30 mL  30 mL Oral Q4H PRN Motley-Mangrum, Jadeka A, PMHNP       magnesium hydroxide (MILK OF MAGNESIA) suspension 30 mL  30 mL Oral Daily PRN Motley-Mangrum, Jadeka A, PMHNP       PTA Medications: Medications Prior to Admission  Medication Sig Dispense Refill Last Dose/Taking   acetaminophen  (TYLENOL ) 325 MG tablet Take 2 tablets (650 mg total) by mouth every 4 (four) hours as needed (for pain scale < 4). (Patient not taking: Reported on 10/09/2024)      coconut oil OIL Apply 1 application topically as needed. (Patient not taking: Reported on 10/09/2024)  0    ibuprofen  (ADVIL ) 600 MG tablet Take 1 tablet (600 mg total) by mouth every 6 (six) hours. (Patient not taking: Reported on 10/09/2024) 30 tablet 0    Norethindrone  Acetate-Ethinyl Estrad-FE (LOESTRIN 24 FE) 1-20 MG-MCG(24) tablet Take 1 tablet by mouth daily. (Patient not taking: Reported on 10/09/2024) 28  tablet 11    ondansetron  (ZOFRAN -ODT) 4 MG disintegrating tablet Take 1 tablet (4 mg total) by mouth every 6 (six) hours as needed for nausea. (Patient not taking: Reported on 10/09/2024) 30 tablet 4    promethazine (PHENERGAN) 25 MG tablet Take 1 tablet (25 mg total) by mouth every 6 (six) hours as needed. (Patient not taking: Reported on 10/09/2024) 30 tablet 0    simethicone  (MYLICON) 80 MG chewable tablet Chew 1 tablet (80 mg total) by mouth as needed for flatulence. (Patient not taking: Reported on 10/09/2024) 30 tablet 0     AIMS:  ,  ,  ,  ,  ,  ,    Musculoskeletal: Strength & Muscle Tone: within normal limits Gait & Station: normal Patient leans: N/A    Psychiatric Specialty Exam: Mental Status Exam:  Appearance: Thin, tired appearing african american female with hair that is long and unbound.   Behavior: Withdrawn, guarded, depressed  Attitude: Cooperative but minimally interactive  Speech: Slow, sparse, quiet  Mood: Depressed  Affect: Congruent. Depressed,  flat.  Thought Process: clear, coherent, linear  Thought Content: denies paranoia  SI/HI: Endorses SI but will not share plan  Perceptions: denies hallucinations, not seen responding  Judgement: Fair  Insight: Fair  Fund of Knowledge: WNL     Sleep  Sleep:No data recorded Estimated Sleeping Duration (Last 24 Hours): 0.00 hours (Due to Daylight Saving Time, the duration displayed may not accurately represent documentation during the time change interval)   Physical Exam: Physical Exam Vitals and nursing note reviewed.  HENT:     Head: Normocephalic and atraumatic.     Nose: Nose normal.  Pulmonary:     Effort: Pulmonary effort is normal.  Neurological:     General: No focal deficit present.     Mental Status: She is alert and oriented to person, place, and time.  Psychiatric:        Attention and Perception: Attention and perception normal.        Mood and Affect: Mood is depressed. Affect is flat.        Speech: Speech normal.        Behavior: Behavior is withdrawn. Behavior is cooperative.        Thought Content: Thought content is not paranoid or delusional. Thought content includes suicidal ideation. Thought content does not include homicidal ideation. Thought content includes suicidal plan.        Cognition and Memory: Cognition and memory normal.        Judgment: Judgment is impulsive.    Review of Systems  Constitutional:  Positive for malaise/fatigue and weight loss. Negative for chills and fever.  Respiratory:  Negative for cough.   Cardiovascular:  Negative for chest pain.  Neurological:  Positive for headaches.  Psychiatric/Behavioral:  Positive for depression and suicidal ideas. Negative for hallucinations, memory loss and substance abuse. The patient is nervous/anxious and has insomnia.    Blood pressure 114/83, pulse 80, temperature 98.5 F (36.9 C), temperature source Oral, resp. rate 16, height 5' 1 (1.549 m), weight 45.4 kg, last menstrual period  07/21/2024, SpO2 100%, currently breastfeeding. Body mass index is 18.89 kg/m.  Assessment: Melissa [name] is a 23 year old female with no significant psychiatric history but with recent medical history significant for intentional pregnancy at [redacted]w[redacted]d who is admitted to the behavioral health unit for worsening depression symptoms and suicidal ideation.  Patient is currently assessed as a high risk to herself due to her constellation of depression symptoms:  sudden weight loss in known, planned pregnancy, poor appetite, extreme low mood, active suicidal ideation with unwillingness to share. Major psychosocial stressors include loss of relationship, minimal social/financial support, increased pressure at work, acute insomnia.   Will conduct lab workup for nutritional deficiencies, encourage feeding ad lib. Start prenatal vitamin. Consider further information about antidepressants at a later time. Will require groups and therapy.   Treatment Plan Summary: Daily contact with patient to assess and evaluate symptoms and progress in treatment  Observation Level/Precautions:  15 minute checks  Laboratory:  Folic Acid Vitamin B-12 Ferritin, Vitamin D  Psychotherapy:  Pt will need CBT, possibly DBT  Medications:  NONE. Patient is pregnant and not ready to talk about medication.  Consultations:    Discharge Concerns:  Safety planning  Estimated LOS: 11/10?  Other:     Physician Treatment Plan for Primary Diagnosis: MDD (major depressive disorder) Long Term Goal(s): Improvement in symptoms so as ready for discharge  Short Term Goals: Ability to identify changes in lifestyle to reduce recurrence of condition will improve, Ability to verbalize feelings will improve, and Ability to disclose and discuss suicidal ideas  Physician Treatment Plan for Secondary Diagnosis: Principal Problem:   MDD (major depressive disorder)  Long Term Goal(s): Improvement in symptoms so as ready for discharge  Short Term  Goals: Ability to identify changes in lifestyle to reduce recurrence of condition will improve, Ability to verbalize feelings will improve, and Ability to disclose and discuss suicidal ideas  I certify that inpatient services furnished can reasonably be expected to improve the patient's condition.    Lynwood Morene Lavone Delsie, MD 11/5/20258:10 AM

## 2024-10-10 NOTE — Plan of Care (Signed)
   Problem: Education: Goal: Knowledge of [facility] Education information/materials will improve Outcome: Progressing Goal: Emotional status will improve Outcome: Progressing Goal: Mental status will improve Outcome: Progressing Goal: Verbalization of understanding the information provided will improve Outcome: Progressing   Problem: Activity: Goal: Interest or engagement in activities will improve Outcome: Progressing Goal: Sleeping patterns will improve Outcome: Progressing   Problem: Coping: Goal: Ability to verbalize frustrations and anger appropriately will improve Outcome: Progressing Goal: Ability to demonstrate self-control will improve Outcome: Progressing   Problem: Health Behavior/Discharge Planning: Goal: Identification of resources available to assist in meeting health care needs will improve Outcome: Progressing Goal: Compliance with treatment plan for underlying cause of condition will improve Outcome: Progressing   Problem: Physical Regulation: Goal: Ability to maintain clinical measurements within normal limits will improve Outcome: Progressing   Problem: Safety: Goal: Periods of time without injury will increase Outcome: Progressing   Problem: Education: Goal: Knowledge of [facility] Education information/materials will improve Outcome: Progressing Goal: Emotional status will improve Outcome: Progressing Goal: Mental status will improve Outcome: Progressing Goal: Verbalization of understanding the information provided will improve Outcome: Progressing   Problem: Activity: Goal: Interest or engagement in activities will improve Outcome: Progressing Goal: Sleeping patterns will improve Outcome: Progressing   Problem: Coping: Goal: Ability to verbalize frustrations and anger appropriately will improve Outcome: Progressing Goal: Ability to demonstrate self-control will improve Outcome: Progressing   Problem: Health  Behavior/Discharge Planning: Goal: Identification of resources available to assist in meeting health care needs will improve Outcome: Progressing Goal: Compliance with treatment plan for underlying cause of condition will improve Outcome: Progressing   Problem: Physical Regulation: Goal: Ability to maintain clinical measurements within normal limits will improve Outcome: Progressing   Problem: Safety: Goal: Periods of time without injury will increase Outcome: Progressing   Problem: Education: Goal: Ability to make informed decisions regarding treatment will improve Outcome: Progressing   Problem: Coping: Goal: Coping ability will improve Outcome: Progressing   Problem: Health Behavior/Discharge Planning: Goal: Identification of resources available to assist in meeting health care needs will improve Outcome: Progressing   Problem: Medication: Goal: Compliance with prescribed medication regimen will improve Outcome: Progressing   Problem: Self-Concept: Goal: Ability to disclose and discuss suicidal ideas will improve Outcome: Progressing Goal: Will verbalize positive feelings about self Outcome: Progressing Note: Patient is on track. Patient will maintain adherence    Problem: Education: Goal: Utilization of techniques to improve thought processes will improve Outcome: Progressing Goal: Knowledge of the prescribed therapeutic regimen will improve Outcome: Progressing   Problem: Activity: Goal: Interest or engagement in leisure activities will improve Outcome: Progressing Goal: Imbalance in normal sleep/wake cycle will improve Outcome: Progressing   Problem: Coping: Goal: Coping ability will improve Outcome: Progressing Goal: Will verbalize feelings Outcome: Progressing   Problem: Health Behavior/Discharge Planning: Goal: Ability to make decisions will improve Outcome: Progressing Goal: Compliance with therapeutic regimen will improve Outcome: Progressing    Problem: Role Relationship: Goal: Will demonstrate positive changes in social behaviors and relationships Outcome: Progressing   Problem: Safety: Goal: Ability to disclose and discuss suicidal ideas will improve Outcome: Progressing Goal: Ability to identify and utilize support systems that promote safety will improve Outcome: Progressing   Problem: Self-Concept: Goal: Will verbalize positive feelings about self Outcome: Progressing Goal: Level of anxiety will decrease Outcome: Progressing

## 2024-10-10 NOTE — Tx Team (Signed)
 Initial Treatment Plan 10/10/2024 2:22 AM Melissa [name] FMW:[id number]    PATIENT STRESSORS: Health problems   Marital or family conflict     PATIENT STRENGTHS: General fund of knowledge  Motivation for treatment/growth    PATIENT IDENTIFIED PROBLEMS: Risk SI  depression  How to deal with my emotions                 DISCHARGE CRITERIA:  Improved stabilization in mood, thinking, and/or behavior Verbal commitment to aftercare and medication compliance  PRELIMINARY DISCHARGE PLAN: Attend aftercare/continuing care group Outpatient therapy  PATIENT/FAMILY INVOLVEMENT: This treatment plan has been presented to and reviewed with the patient, Melissa [name].  The patient and family have been given the opportunity to ask questions and make suggestions.  Orlando Ozell LABOR, RN 10/10/2024, 2:22 AM

## 2024-10-10 NOTE — Progress Notes (Deleted)
   10/10/24 1800  Psych Admission Type (Psych Patients Only)  Admission Status Voluntary  Psychosocial Assessment  Patient Complaints Anxiety;Depression  Eye Contact Fair  Facial Expression Sad  Affect Depressed  Speech Soft  Interaction Isolative  Motor Activity Slow  Appearance/Hygiene Disheveled  Behavior Characteristics Calm  Mood Depressed  Thought Process  Coherency WDL  Content WDL  Delusions None reported or observed  Perception WDL  Hallucination None reported or observed  Judgment WDL  Confusion WDL  Danger to Self  Current suicidal ideation? Denies  Danger to Others  Danger to Others None reported or observed   Pt has been isolative to room most of the day. Pt remains passively suicidal. Pt does contract for safety.

## 2024-10-10 NOTE — BH IP Treatment Plan (Signed)
 Interdisciplinary Treatment and Diagnostic Plan Update  10/10/2024 Time of Session: 10:15 AM Melissa [name] MRN: [id number]  Principal Diagnosis: MDD (major depressive disorder)  Secondary Diagnoses: Principal Problem:   MDD (major depressive disorder)   Current Medications:  Current Facility-Administered Medications  Medication Dose Route Frequency Provider Last Rate Last Admin   acetaminophen  (TYLENOL ) tablet 650 mg  650 mg Oral Q6H PRN Motley-Mangrum, Jadeka A, PMHNP       alum & mag hydroxide-simeth (MAALOX/MYLANTA) 200-200-20 MG/5ML suspension 30 mL  30 mL Oral Q4H PRN Motley-Mangrum, Jadeka A, PMHNP       magnesium hydroxide (MILK OF MAGNESIA) suspension 30 mL  30 mL Oral Daily PRN Motley-Mangrum, Jadeka A, PMHNP       PTA Medications: Medications Prior to Admission  Medication Sig Dispense Refill Last Dose/Taking   acetaminophen  (TYLENOL ) 325 MG tablet Take 2 tablets (650 mg total) by mouth every 4 (four) hours as needed (for pain scale < 4). (Patient not taking: Reported on 10/09/2024)      coconut oil OIL Apply 1 application topically as needed. (Patient not taking: Reported on 10/09/2024)  0    ibuprofen  (ADVIL ) 600 MG tablet Take 1 tablet (600 mg total) by mouth every 6 (six) hours. (Patient not taking: Reported on 10/09/2024) 30 tablet 0    Norethindrone  Acetate-Ethinyl Estrad-FE (LOESTRIN 24 FE) 1-20 MG-MCG(24) tablet Take 1 tablet by mouth daily. (Patient not taking: Reported on 10/09/2024) 28 tablet 11    ondansetron  (ZOFRAN -ODT) 4 MG disintegrating tablet Take 1 tablet (4 mg total) by mouth every 6 (six) hours as needed for nausea. (Patient not taking: Reported on 10/09/2024) 30 tablet 4    promethazine (PHENERGAN) 25 MG tablet Take 1 tablet (25 mg total) by mouth every 6 (six) hours as needed. (Patient not taking: Reported on 10/09/2024) 30 tablet 0    simethicone  (MYLICON) 80 MG chewable tablet Chew 1 tablet (80 mg total) by mouth as needed for flatulence. (Patient not  taking: Reported on 10/09/2024) 30 tablet 0     Patient Stressors: Health problems   Marital or family conflict    Patient Strengths: General fund of knowledge  Motivation for treatment/growth   Treatment Modalities: Medication Management, Group therapy, Case management,  1 to 1 session with clinician, Psychoeducation, Recreational therapy.   Physician Treatment Plan for Primary Diagnosis: MDD (major depressive disorder) Long Term Goal(s):     Short Term Goals:    Medication Management: Evaluate patient's response, side effects, and tolerance of medication regimen.  Therapeutic Interventions: 1 to 1 sessions, Unit Group sessions and Medication administration.  Evaluation of Outcomes: Not Progressing  Physician Treatment Plan for Secondary Diagnosis: Principal Problem:   MDD (major depressive disorder)  Long Term Goal(s):     Short Term Goals:       Medication Management: Evaluate patient's response, side effects, and tolerance of medication regimen.  Therapeutic Interventions: 1 to 1 sessions, Unit Group sessions and Medication administration.  Evaluation of Outcomes: Not Progressing   RN Treatment Plan for Primary Diagnosis: MDD (major depressive disorder) Long Term Goal(s): Knowledge of disease and therapeutic regimen to maintain health will improve  Short Term Goals: Ability to remain free from injury will improve, Ability to verbalize frustration and anger appropriately will improve, Ability to demonstrate self-control, Ability to participate in decision making will improve, Ability to verbalize feelings will improve, Ability to disclose and discuss suicidal ideas, Ability to identify and develop effective coping behaviors will improve, and Compliance with prescribed medications will  improve  Medication Management: RN will administer medications as ordered by provider, will assess and evaluate patient's response and provide education to patient for prescribed medication.  RN will report any adverse and/or side effects to prescribing provider.  Therapeutic Interventions: 1 on 1 counseling sessions, Psychoeducation, Medication administration, Evaluate responses to treatment, Monitor vital signs and CBGs as ordered, Perform/monitor CIWA, COWS, AIMS and Fall Risk screenings as ordered, Perform wound care treatments as ordered.  Evaluation of Outcomes: Not Progressing   LCSW Treatment Plan for Primary Diagnosis: MDD (major depressive disorder) Long Term Goal(s): Safe transition to appropriate next level of care at discharge, Engage patient in therapeutic group addressing interpersonal concerns.  Short Term Goals: Engage patient in aftercare planning with referrals and resources, Increase social support, Increase ability to appropriately verbalize feelings, Increase emotional regulation, Facilitate acceptance of mental health diagnosis and concerns, Facilitate patient progression through stages of change regarding substance use diagnoses and concerns, Identify triggers associated with mental health/substance abuse issues, and Increase skills for wellness and recovery  Therapeutic Interventions: Assess for all discharge needs, 1 to 1 time with Social worker, Explore available resources and support systems, Assess for adequacy in community support network, Educate family and significant other(s) on suicide prevention, Complete Psychosocial Assessment, Interpersonal group therapy.  Evaluation of Outcomes: Not Progressing   Progress in Treatment: Attending groups: No. Participating in groups: No. Taking medication as prescribed: patient hasn't been prescribed any medications yet Toleration medication: N/A Family/Significant other contact made: No, will contact:  consents are pending Patient understands diagnosis: Yes. Discussing patient identified problems/goals with staff: Yes. Medical problems stabilized or resolved: Yes. Denies suicidal/homicidal ideation:  Yes. Issues/concerns per patient self-inventory: No.  New problem(s) identified:  No  New Short Term/Long Term Goal(s):   medication stabilization, elimination of SI thoughts, development of comprehensive mental wellness plan.    Patient Goals:  I want to learn how to deal with my emotions.    Discharge Plan or Barriers:  Patient recently admitted. CSW will continue to follow and assess for appropriate referrals and possible discharge planning.    Reason for Continuation of Hospitalization: Depression Medication stabilization Suicidal ideation  Estimated Length of Stay:  5 - 7 days  Last 3 [place] Suicide Severity Risk Score: Flowsheet Row Ad[facility] (Current) [facility] from 10/09/2024 in [facility] Admission (Discharged) from 07/15/2021 in [place] [facility]  C-SSRS RISK CATEGORY High Risk High Risk No Risk    Last PHQ 2/9 Scores:    10/09/2024    5:00 PM 10/04/2024    2:12 PM 08/21/2021   11:06 AM  Depression screen PHQ 2/9  Decreased Interest 1 0 0  Down, Depressed, Hopeless 3 0 0  PHQ - 2 Score 4 0 0  Altered sleeping 1 0 0  Tired, decreased energy 1 1 0  Change in appetite 1 1 0  Feeling bad or failure about yourself  2 0 0  Trouble concentrating 2 0 0  Moving slowly or fidgety/restless 1 1 0  Suicidal thoughts 3 0 0  PHQ-9 Score 15 3 0    Scribe for Treatment Team: Timira Bieda O Valbona Slabach, LCSWA 10/10/2024 6:09 PM

## 2024-10-10 NOTE — Group Note (Signed)
 Date:  10/10/2024 Time:  9:43 AM  Group Topic/Focus:  Emotional Education:   The focus of this group is to discuss what feelings/emotions are, and how they are experienced. Goals Group:   The focus of this group is to help patients establish daily goals to achieve during treatment and discuss how the patient can incorporate goal setting into their daily lives to aide in recovery.    Participation Level:  Did Not Attend   Melissa [name] 10/10/2024, 9:43 AM

## 2024-10-10 NOTE — Plan of Care (Signed)
   Problem: Education: Goal: Emotional status will improve Outcome: Progressing Goal: Mental status will improve Outcome: Progressing   Problem: Activity: Goal: Interest or engagement in activities will improve Outcome: Progressing Goal: Sleeping patterns will improve Outcome: Progressing

## 2024-10-10 NOTE — Group Note (Signed)
 Date:  10/10/2024 Time:  10:35 AM  Group Topic/Focus:  Recreation Therapy     Participation Level:  Did Not Attend  Melissa [name] 10/10/2024, 10:35 AM

## 2024-10-10 NOTE — [facility] Group Notes (Signed)
 [facility] Group Notes:  (Nursing/MHT/Case Management/Adjunct)  Date:  10/10/2024  Time:  9:30 PM  Type of Therapy:  Wrap-up group  Participation Level:  Active  Participation Quality:  Appropriate  Affect:  Appropriate  Cognitive:  Appropriate  Insight:  Appropriate  Engagement in Group:  Engaged  Modes of Intervention:  Education  Summary of Progress/Problems: Goal give herself space. Rated day 8/10.   Grayce LITTIE Essex 10/10/2024, 9:30 PM

## 2024-10-10 NOTE — [facility] Group Notes (Signed)
 Patient did not attend the RN group.

## 2024-10-10 NOTE — Progress Notes (Signed)
   10/10/24 1800  Psych Admission Type (Psych Patients Only)  Admission Status Voluntary  Psychosocial Assessment  Patient Complaints Anxiety;Depression  Eye Contact Fair  Facial Expression Sad  Affect Depressed  Speech Soft  Interaction Isolative  Motor Activity Slow  Appearance/Hygiene Disheveled  Behavior Characteristics Calm  Mood Depressed  Thought Process  Coherency WDL  Content WDL  Delusions None reported or observed  Perception WDL  Hallucination None reported or observed  Judgment WDL  Confusion WDL  Danger to Self  Current suicidal ideation? Denies  Danger to Others  Danger to Others None reported or observed

## 2024-10-10 NOTE — Group Note (Signed)
 Date:  10/10/2024 Time:  4:43 PM  Group Topic/Focus:  Dimensions of Wellness:   The focus of this group is to introduce the topic of wellness and discuss the role each dimension of wellness plays in total health.    Participation Level:  Did Not Attend  Participation Quality:    Affect:    Cognitive:    Insight:   Engagement in Group:    Modes of Intervention:    Additional Comments:    Melissa [name] 10/10/2024, 4:43 PM

## 2024-10-10 NOTE — Progress Notes (Signed)
(  Sleep Hours) -12.25  (Any PRNs that were needed, meds refused, or side effects to meds)- none  (Any disturbances and when (visitation, over night)- none  (Concerns raised by the patient)- denies , Ask me 3 discussed with pt and verbal understanding stated.    (SI/HI/AVH)- denies

## 2024-10-10 NOTE — Progress Notes (Signed)
 Patient ID: Melissa [name], female   DOB: Apr 04, 2001, 23 y.o.   MRN: [id number] Admission Note:  23 yr female who presents [facility] in no acute distress for the treatment of SI and Depression. Pt appears flat and depressed. Pt was calm and cooperative with admission process. Pt stated she had a break up with the father of the baby (pt [redacted] wks pregnant) after 2 year relationship. Pt stated her depression started getting worse.   Per assessment: During evaluation, patient was guarded, irritable, and minimally engaged. Provider had to repeatedly prompt for responses as patient frequently replied "I'm tired" and "I don't want to talk." She reports she has been crying all night and has had no sleep. She states she feels hopeless, exhausted, and unsupported.  Patient reports she lives with her 70-year-old son and works at [employer]. She states her son is currently with his father. She identifies limited support--mother deceased, limited connection with siblings, and only "sometimes close" with one brother. She reports no family support and no one to help her. She is not connected with WIC or supportive services.  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 She denies HI/AVH. She denies any prior psychiatric diagnosis, past psychiatric admissions, or history of psychiatric medications.   She later disclosed that the triggering event leading to today's crisis was her breakup with the father of her unborn baby, who told her he no longer wants to be with her. Patient reports this significantly worsened her emotional state and contributed to her suicidal thoughts. Patient called her OB clinic ([facility]) earlier today and informed them she was having active SI with intent.   A:Skin was assessed (Kenisha-RN) and found to be clear of any abnormal marks apart from multiple tattoos. PT searched and no contraband found, POC and  unit policies explained and understanding verbalized. Consents obtained. Food and fluids offered, and  accepted.  R: Pt had no additional questions or concerns.

## 2024-10-10 NOTE — ED Notes (Signed)
 [facility] called for transport ETA 30 minutes.

## 2024-10-10 NOTE — Plan of Care (Signed)
   Problem: Activity: Goal: Sleeping patterns will improve Outcome: Progressing   Problem: Safety: Goal: Periods of time without injury will increase Outcome: Progressing

## 2024-10-11 LAB — VITAMIN D 25 HYDROXY (VIT D DEFICIENCY, FRACTURES): Vit D, 25-Hydroxy: 6.05 ng/mL — ABNORMAL LOW (ref 30–100)

## 2024-10-11 MED ORDER — CHOLECALCIFEROL 10 MCG (400 UNIT) PO TABS
400.0000 [IU] | ORAL_TABLET | Freq: Every day | ORAL | Status: DC
  Administered 2024-10-11 – 2024-10-14 (×4): 400 [IU] via ORAL
  Filled 2024-10-11 (×4): qty 1

## 2024-10-11 MED ORDER — PRENATAL MULTIVITAMIN CH
1.0000 | ORAL_TABLET | Freq: Every day | ORAL | Status: DC
  Filled 2024-10-11: qty 1

## 2024-10-11 MED ORDER — VITAMIN D (ERGOCALCIFEROL) 1.25 MG (50000 UNIT) PO CAPS: 50000.0000 [IU] | ORAL_CAPSULE | ORAL | Status: DC

## 2024-10-11 MED ORDER — ENSURE PLUS HIGH PROTEIN PO LIQD
237.0000 mL | Freq: Three times a day (TID) | ORAL | Status: DC
  Administered 2024-10-11 – 2024-10-14 (×8): 237 mL via ORAL
  Filled 2024-10-11 (×11): qty 237

## 2024-10-11 NOTE — Group Note (Signed)
 Date:  10/11/2024 Time:  10:36 AM  Group Topic/Focus:  Nutrition Group    Participation Level:  Did Not Attend   Melissa [name] Melissa [name] 10/11/2024, 10:36 AM

## 2024-10-11 NOTE — Progress Notes (Signed)
 [facility] MD Progress Note 10/11/2024 8:12 PM Melissa [name]  MRN:  [id number] Principal Problem: MDD (major depressive disorder) Diagnosis: Principal Problem:   MDD (major depressive disorder)   Subjective:   Melissa [name] is a 23 year old female with no significant psychiatric history but with recent medical history significant for intentional pregnancy at [redacted]w[redacted]d who is admitted to the behavioral health unit for worsening depression symptoms and suicidal ideation.   Chart Review from last 24 hours and discussion during bed progression: The patient's chart was reviewed and nursing notes were reviewed. The patient's case was discussed in multidisciplinary team meeting.   - events per chart review / staff report: none - Patient did not receive any scheduled medications - Patient did not receive any PRN medications  Per Patient:  On assessment today, the patient reports mild improvement of her symptoms after one day. She reports that she knows she needed some help. Discussed several aspects of her difficulties and the tendency to isolation, rumination, and self-chastisment in situations of depression. Discussed that the patient is most interested in therapy to assist in her depression. Conducted brief discussion on how antidepressants, which while not totally risk free in pregnancy, are safer than continued malnutrition and poor self-care.   Sleep: Fair Appetite:  Fair Depression: still very high Anxiety: high Auditory Hallucinations: denies, not seen responding Visual Hallucinations: denies, not seen responding Paranoia: denies HI: denies SI: denies passive SI for first time Side effects from medications: does not endorse any side-effects they attribute to medications. Other concerns discussed with patient:  Review of Systems  Constitutional:  Positive for malaise/fatigue and weight loss. Negative for chills and fever.  Respiratory:  Negative for shortness of breath.    Cardiovascular:  Negative for chest pain.  Gastrointestinal:  Negative for abdominal pain, constipation, diarrhea, nausea and vomiting.  Genitourinary:  Negative for dysuria and urgency.  Neurological:  Negative for headaches.  Psychiatric/Behavioral:  Positive for depression. Negative for hallucinations, substance abuse and suicidal ideas. The patient is not nervous/anxious.     Total time spent with patient: 30 minutes  HX from H&P: Past Psychiatric Hx: Previous Psych Diagnoses: None Prior inpatient treatment: None Current/prior outpatient treatment: saw a clinical biochemist when younger, no formal dx Prior rehab hx: denies Psychotherapy hx:no History of suicide: no History of homicide or aggression: None Psychiatric medication history: None Psychiatric medication compliance history: N/A Neuromodulation history: None Current Psychiatrist: None Current therapist: None   Substance Abuse Hx: Alcohol: No  Tobacco: no history Illicit drugs:  Marijuana/THC: used to smoke, quit 4 years ago prior to pregnancy  Other: no hx of other drugs   Past Medical History: Medical Diagnoses: 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 Adopted by Aunt/uncle at age 19. Graduated high school in [place]. No school afterwards. Grades were good, no IEP. No behavioral issues. Abuse: Patient denies, but avoids eye contact on this question Marital  Status: single, never married. Recent relationship breakup Sexual orientation: prefers men Children: 3  year old son Melissa [name], currently with his father Employment: Print production planner at [employer] Peer Group: Denies close friends Housing: Has her own apartment Finances: strained Legal: no legal issues or pending court cases Military: no affiliation   Past Medical History (auto-populated):  Past Medical History:  Diagnosis Date   Medical history non-contributory    Seasonal allergies     Past Surgical History:  Procedure Laterality Date   NO PAST SURGERIES      Social History:  Social History   Substance and Sexual Activity  Alcohol Use No     Social History   Substance and Sexual Activity  Drug Use No    Social History   Socioeconomic History   Marital status: Single    Spouse name: Not on file   Number of children: 1   Years of education: Not on file   Highest education level: Not on file  Occupational History   Occupation: Dietary Aid  Tobacco Use   Smoking status: Never   Smokeless tobacco: Never  Vaping Use   Vaping status: Never Used  Substance and Sexual Activity   Alcohol use: No   Drug use: No   Sexual activity: Yes    Birth control/protection: None  Other Topics Concern   Not on file  Social History Narrative   Not on file   Social Drivers of Health   Financial Resource Strain: Not on File (09/17/2022)   Received from [facility]    Financial Resource Strain: 0  Food Insecurity: No Food Insecurity (10/10/2024)   Hunger Vital Sign    Worried About Running Out of Food in the Last Year: Never true    Ran Out of Food in the Last Year: Never true  Transportation Needs: No Transportation Needs (10/10/2024)   PRAPARE - Administrator, Civil Service (Medical): No    Lack of Transportation (Non-Medical): No  Physical Activity: Not on File (09/17/2022)   Received from [facility]   Physical Activity     Physical Activity: 0  Stress: Not on File (03/25/2022)   Received from [facility]   Stress    Stress: 0  Social Connections: Not on File (08/15/2023)   Received from [facility]   Social Connections    Connectedness: 0   Additional Social History:  Objective: Medications, Labs, Mental Status Exam, Physical Exam Current Medications: Current Facility-Administered Medications  Medication Dose Route Frequency Provider Last Rate Last Admin   acetaminophen  (TYLENOL ) tablet 650 mg  650 mg Oral Q6H PRN Motley-Mangrum, Jadeka A, PMHNP       alum & mag hydroxide-simeth (MAALOX/MYLANTA) 200-200-20 MG/5ML suspension 30 mL  30 mL Oral Q4H PRN Motley-Mangrum, Jadeka A, PMHNP       cholecalciferol (VITAMIN D3) 10 MCG (400 UNIT) tablet 400 Units  400 Units Oral Daily Delsie Lynwood Morene Lavone, MD   400 Units at 10/11/24 1254   feeding supplement (ENSURE PLUS HIGH PROTEIN) liquid 237 mL  237 mL Oral TID BM Delsie Lynwood Morene Lavone, MD       magnesium hydroxide (MILK OF MAGNESIA) suspension 30 mL  30 mL Oral Daily PRN Motley-Mangrum, Jadeka A, PMHNP       prenatal multivitamin tablet 1 tablet  1 tablet Oral Daily Pashayan, Alexander S, DO       Vitamin D (Ergocalciferol) (DRISDOL) 1.25 MG (50000 UNIT) capsule 50,000 Units  50,000 Units Oral Q7 days Delsie Lynwood Morene Lavone, MD  Lab Results:  Results for orders placed or performed during the hospital encounter of 10/10/24 (from the past 48 hours)  Ferritin     Status: None   Collection Time: 10/10/24  6:49 PM  Result Value Ref Range   Ferritin 18 11 - 307 ng/mL    Comment: Performed at [facility], [address], [place], [place] [address]  Vitamin B12     Status: None   Collection Time: 10/10/24  6:49 PM  Result Value Ref Range   Vitamin B-12 371 180 - 914 pg/mL    Comment: Performed at [facility], [address] [place], [place] [address]  Folate     Status: None   Collection Time: 10/10/24   6:49 PM  Result Value Ref Range   Folate 12.8 >5.9 ng/mL    Comment: Performed at [facility], [address], [place], [place] [address]  VITAMIN D 25 Hydroxy (Vit-D Deficiency, Fractures)     Status: Abnormal   Collection Time: 10/10/24  6:49 PM  Result Value Ref Range   Vit D, 25-Hydroxy 6.05 (L) 30 - 100 ng/mL    Comment: (NOTE) Vitamin D deficiency has been defined by the Institute of Medicine  and an Endocrine Society practice guideline as a level of serum 25-OH  vitamin D less than 20 ng/mL (1,2). The Endocrine Society went on to  further define vitamin D insufficiency as a level between 21 and 29  ng/mL (2).  1. IOM (Institute of Medicine). 2010. Dietary reference intakes for  calcium and D. [place]: [employer]. 2. Holick MF, Binkley [place], Bischoff-Ferrari HA, et al. Evaluation,  treatment, and prevention of vitamin D deficiency: an Endocrine  Society clinical practice guideline, JCEM. 2011 Jul; 96(7): 1911-30.  Performed at [facility], [address] [place], [place] [address]   TSH     Status: None   Collection Time: 10/10/24  6:49 PM  Result Value Ref Range   TSH 0.363 0.350 - 4.500 uIU/mL    Comment: Performed at [facility], [address], [place], [place] [address]    Blood Alcohol level:  Lab Results  Component Value Date   [facility] <15 10/09/2024    Metabolic Disorder Labs: Lab Results  Component Value Date   HGBA1C 5.0 10/04/2024   No results found for: PROLACTIN No results found for: CHOL, TRIG, HDL, CHOLHDL, VLDL, LDLCALC  Physical Findings: AIMS:  , ,  ,  ,    CIWA:    COWS:     Musculoskeletal: Strength & Muscle Tone: within normal limits Gait & Station: normal Patient leans: N/A  Mental Status Exam Appearance: Thin, tired appearing african american female with hair that is long and unbound.   Behavior: Withdrawn, guarded, depressed  Attitude: Cooperative but  minimally interactive  Speech: Slow, sparse, quiet  Mood: Depressed  Affect: Congruent. Depressed, flat.  Thought Process: clear, coherent, linear  Thought Content: denies paranoia  SI/HI: Endorses SI but will not share plan  Perceptions: denies hallucinations, not seen responding  Judgement: Fair  Insight: Fair  Fund of Knowledge: WNL         Physical Exam: Physical Exam HENT:     Head: Normocephalic and atraumatic.     Nose: Nose normal.  Neurological:     Mental Status: She is alert and oriented to person, place, and time.  Psychiatric:        Attention and Perception: Attention and  perception normal.        Mood and Affect: Mood is depressed. Affect is flat.        Speech: Speech is delayed.        Behavior: Behavior is slowed and withdrawn. Behavior is cooperative.        Thought Content: Thought content is not paranoid or delusional. Thought content does not include homicidal or suicidal ideation.        Cognition and Memory: Cognition and memory normal.        Judgment: Judgment normal.     Blood pressure 103/74, pulse 68, temperature 98.3 F (36.8 C), temperature source Oral, resp. rate 14, height 5' 1 (1.549 m), weight 45.4 kg, last menstrual period 07/21/2024, SpO2 100%, currently breastfeeding. Body mass index is 18.89 kg/m.  ASSESSMENT: Gayleen First is a 23 year old female with no significant psychiatric history but with recent medical history significant for intentional pregnancy at [redacted]w[redacted]d who is admitted to the behavioral health unit for worsening depression symptoms and suicidal ideation.   Patient is currently assessed as a high risk to herself due to her constellation of depression symptoms: sudden weight loss in known, planned pregnancy, poor appetite, extreme low mood, active suicidal ideation with unwillingness to share. Major psychosocial stressors include loss of relationship, minimal social/financial support, increased pressure at work, acute  insomnia.    11/6: Lab workup showed severe vitamin D deficiency. Folate, b12, and ferritin levels WNL. Still need to put patient on ensure feedings and multivitamin for health of patient and baby.  Diagnoses / Active Problems: Major Depressive Disorder  PLAN: Safety and Monitoring:  --  Voluntary admission to inpatient psychiatric unit for safety, stabilization and treatment  -- Daily contact with patient to assess and evaluate symptoms and progress in treatment  -- Patient's case to be discussed in multi-disciplinary team meeting  -- Observation Level : q15 minute checks  -- Vital signs:  q12 hours  -- Precautions: suicide, elopement, and assault  2. Psychiatric Diagnoses and Treatment:  Start prenatal vitamin for nutrition support Start vitamin D3 high dose for severe deficiency, start low dose daily d3 while inpatient to assist.    The risks/benefits/side-effects/alternatives to this medication were discussed in detail with the patient and time was given for questions. The patient consents to medication trial.  Metabolic profile and EKG monitoring obtained while on an atypical antipsychotic Body mass index is 18.89 kg/m. Lipid Panel: Hbg A1c:  QTc: 407 ms, last obtained 10/11/2024 Encouraged patient to participate in unit milieu and in scheduled group therapies   -- Short Term Goals: Ability to identify changes in lifestyle to reduce recurrence of condition will improve, Ability to verbalize feelings will improve, Ability to disclose and discuss suicidal ideas, and Ability to identify and develop effective coping behaviors will improve  -- Long Term Goals: Improvement in symptoms so as ready for discharge    3. Medical Issues Being Addressed:  Nutritional deficiencies Labs reviewed, notable for pregnancy status, low vitamin D, positive Kells antibody, mild anemia, otherwise unremarkable  Tobacco Use Disorder  --  Patient does not need nicotine replacement  -- Smoking  cessation encouraged  4. Discharge Planning:   -- Social work and case management to assist with discharge planning and identification of hospital follow-up needs prior to discharge  -- Estimated discharge: 11/11  -- Discharge Concerns: Need to establish a safety plan; Medication compliance and effectiveness  -- Discharge Goals: Return home with outpatient referrals for mental health follow-up including medication  management/psychotherapy   Lynwood Katz Lavone Bash, MD 10/11/2024, 8:12 PM

## 2024-10-11 NOTE — Group Note (Signed)
 Date:  10/11/2024 Time:  2:25 PM  Group Topic/Focus:  Emotional Education:   The focus of this group is to discuss what feelings/emotions are, and how they are experienced.    Participation Level:  Active  Participation Quality:  Appropriate  Affect:  Appropriate  Cognitive:  Appropriate  Insight: Appropriate  Engagement in Group:  Engaged  Modes of Intervention:  Discussion  Additional Comments:  Goes on drives and listens to music when having bad day. Finds this to be beneficial coping skill.   Melissa [name] [name] 10/11/2024, 2:25 PM

## 2024-10-11 NOTE — [facility] Group Notes (Signed)
 [facility] Group Notes:  (Nursing/MHT/Case Management/Adjunct)  Date:  10/11/2024  Time:  10:44 PM  Type of Therapy:  Wrap-up group  Participation Level:  Active  Participation Quality:  Appropriate  Affect:  Appropriate  Cognitive:  Appropriate  Insight:  Appropriate  Engagement in Group:  Engaged  Modes of Intervention:  Education  Summary of Progress/Problems: Goal to attend all groups. Met goal, rated day 9/10.  Melissa [name] 10/11/2024, 10:44 PM

## 2024-10-11 NOTE — Group Note (Unsigned)
 Date:  10/11/2024 Time:  7:56 AM  Group Topic/Focus:  Coping With Mental Health Crisis:   The purpose of this group is to help patients identify strategies for coping with mental health crisis.  Group discusses possible causes of crisis and ways to manage them effectively.     Participation Level:  {BHH PARTICIPATION OZCZO:77735}  Participation Quality:  {BHH PARTICIPATION QUALITY:22265}  Affect:  {BHH AFFECT:22266}  Cognitive:  {BHH COGNITIVE:22267}  Insight: {BHH Insight2:20797}  Engagement in Group:  {BHH ENGAGEMENT IN HMNLE:77731}  Modes of Intervention:  {BHH MODES OF INTERVENTION:22269}  Additional Comments:  ***  Jaquetta Currier E Bravery Ketcham 10/11/2024, 7:56 AM

## 2024-10-11 NOTE — Group Note (Signed)
 Date:  10/11/2024 Time:  1:54 PM  Group Topic/Focus:  Emotional Education:   The focus of this group is to discuss what feelings/emotions are, and how they are experienced.    Participation Level:  Active  Participation Quality:  Appropriate  Affect:  Appropriate  Cognitive:  Appropriate  Insight: Appropriate  Engagement in Group:  Engaged  Modes of Intervention:  Discussion  Additional Comments:  Goes on drives and listens to music when having bad day. Finds this to be beneficial coping skill.   Lolita Faulds A Delphina Schum 10/11/2024, 1:54 PM

## 2024-10-11 NOTE — [facility] Group Notes (Signed)
 Patient did not attend OT group.

## 2024-10-11 NOTE — Progress Notes (Signed)
   10/11/24 1400  Psych Admission Type (Psych Patients Only)  Admission Status Voluntary  Psychosocial Assessment  Patient Complaints Anxiety;Depression  Eye Contact Fair  Facial Expression Sad  Affect Fearful;Preoccupied  Speech Soft  Interaction Cautious  Motor Activity Slow  Appearance/Hygiene Disheveled  Behavior Characteristics Cooperative  Mood Depressed  Thought Process  Coherency Blocking;WDL  Content WDL  Delusions None reported or observed  Perception WDL  Hallucination None reported or observed  Judgment WDL  Confusion WDL  Danger to Self  Current suicidal ideation? Denies  Description of Suicide Plan No Plan  Agreement Not to Harm Self Yes  Description of Agreement Verbal  Danger to Others  Danger to Others None reported or observed

## 2024-10-11 NOTE — Plan of Care (Signed)
   Problem: Education: Goal: Knowledge of [place] [facility] Education information/materials will improve Outcome: Progressing Goal: Emotional status will improve Outcome: Progressing

## 2024-10-11 NOTE — Progress Notes (Signed)
(  Sleep Hours) - (Any PRNs that were needed, meds refused, or side effects to meds)- none (Any disturbances and when (visitation, over night)- none (Concerns raised by the patient)- none (SI/HI/AVH)- denies

## 2024-10-11 NOTE — Plan of Care (Signed)
   Problem: Education: Goal: Emotional status will improve Outcome: Progressing Goal: Mental status will improve Outcome: Progressing   Problem: Activity: Goal: Interest or engagement in activities will improve Outcome: Progressing Goal: Sleeping patterns will improve Outcome: Progressing   Problem: Safety: Goal: Periods of time without injury will increase Outcome: Progressing

## 2024-10-11 NOTE — Group Note (Signed)
 Date:  10/11/2024 Time:  9:30 AM  Group Topic/Focus:  Emotional Education:   The focus of this group is to discuss what feelings/emotions are, and how they are experienced. Goals Group:   The focus of this group is to help patients establish daily goals to achieve during treatment and discuss how the patient can incorporate goal setting into their daily lives to aide in recovery. Orientation:   The focus of this group is to educate the patient on the purpose and policies of crisis stabilization and provide a format to answer questions about their admission.  The group details unit policies and expectations of patients while admitted.    Participation Level:  Did Not Attend   Melissa [name] 10/11/2024, 9:30 AM

## 2024-10-11 NOTE — Group Note (Signed)
 LCSW Group Therapy Note   Group Date: 10/11/2024 Start Time: 1100 End Time: 1200   Participation:  did not attend  Type of Therapy:  Group Therapy   Topic:  Stronger Together:  Building Healthy Relationships  Objective:  To explore loneliness, boundaries, and safe ways to build relationships.  Goals: - Recognize healthy vs. unhealthy relationships. - Learn safe ways to connect with others. - Psychiatrist.  Summary:  Participants discussed loneliness, healthy connections, and setting boundaries. They explored safe ways to meet people and shared personal experiences. Key insights were reinforced through discussion and quotes.  Therapeutic Modalities Used: - Cognitive Behavioral Therapy (CBT) Elements - Identifying unhealthy relationship patterns, challenging negative thoughts about connection. - Dialectical Behavior Therapy (DBT) Elements - Interpersonal effectiveness, setting and maintaining boundaries. - Supportive Group Therapy - Peer discussion, shared experiences, and emotional validation.   Samuele Storey O Shawnell Dykes, LCSWA 10/11/2024  4:39 PM

## 2024-10-11 NOTE — BHH Counselor (Signed)
 Adult Comprehensive Assessment  Patient ID: Melissa [name], female   DOB: 12-29-2000, 23 y.o.   MRN: [id number]  Information Source: Information source: Patient  Current Stressors:  Patient states their primary concerns and needs for treatment are:: to feel better Patient states their goals for this hospitilization and ongoing recovery are:: to get better and be able to handle things better Educational / Learning stressors: denies Employment / Job issues: change in shift Family Relationships: we are  not as close as we were Surveyor, Quantity / Lack of resources (include bankruptcy): limited Housing / Lack of housing: has her own apt Physical health (include injuries & life threatening diseases): current pregnant Social relationships: denies any social relationships Substance abuse: denies Bereavement / Loss: relationship ended  Living/Environment/Situation:  Living Arrangements: Alone Who else lives in the home?: her 72 yr old son  Family History:  Marital status: Single Are you sexually active?: Yes What is your sexual orientation?: straight Does patient have children?: Yes How many children?: 1 How is patient's relationship with their children?: good; currently pregnant  Childhood History:  By whom was/is the patient raised?: Adoptive parents Additional childhood history information: reports that she was adopted as a small child  Education:  Highest grade of school patient has completed: graduated from [employer] Currently a consulting civil engineer?: No Learning disability?: No  Employment/Work Situation:   Employment Situation: Employed Where is Patient Currently Employed?: [employer] Satisfied With Your Job?: No Do You Work More Than One Job?: No Work Stressors: change in shift Has Patient ever Been in Equities Trader?: No  Financial Resources:   Financial resources: Income from employment Does patient have a representative payee or guardian?:  No  Alcohol/Substance Abuse:   What has been your use of drugs/alcohol within the last 12 months?: denies  Social Support System:   Forensic Psychologist System: None Type of faith/religion: denies How does patient's faith help to cope with current illness?: denies  Leisure/Recreation:   Do You Have Hobbies?: Yes Leisure and Hobbies: being with son  Strengths/Needs:   What is the patient's perception of their strengths?: uta Patient states they can use these personal strengths during their treatment to contribute to their recovery: uta Patient states these barriers may affect/interfere with their treatment: uta Patient states these barriers may affect their return to the community: uta Other important information patient would like considered in planning for their treatment: uta  Discharge Plan:   Currently receiving community mental health services: No Patient states concerns and preferences for aftercare planning are: reports that she wants a therapist Patient states they will know when they are safe and ready for discharge when: things start to seem clear Does patient have access to transportation?: Yes Does patient have financial barriers related to discharge medications?: No Patient description of barriers related to discharge medications: has insurance Will patient be returning to same living situation after discharge?: Yes  Summary/Recommendations:   Summary and Recommendations (to be completed by the evaluator): Melissa [name] is a 23 year old woman that was admitted into [facility] on 10/10/2024 after verbalizing thoughts of suicide to her OB.  She list symptoms of depression such as: lack of motivation, thoughts of suicide, fatigue, etc. She has no prior psychiatric history, but with a recent medical history notable for an intentional pregnancy at 11 weeks and 4 days' gestation. She was admitted to the Behavioral Health Unit due to worsening depressive symptoms and suicidal  ideation.  Over the past year, the patient has experienced  a gradual increase in depressive symptoms, with a marked exacerbation following her medical appointment on 08/29/24, when she was informed of the presence of Kell antigen autoimmune antibodies, and after the recent loss of her two-year relationship with her baby's father last week.  Melissa [name] reports numerous stressors over the past year, including strained family relationships, financial difficulties, challenges adjusting to shift work at her job in an [employer] center, and a pervasive sense of loneliness.  She has previously disclosed suicidal ideation with a plan to several providers, though she declined to share the specific details of her plan during this initial interview. While here, Jeremy can benefit from crisis stabilization, medication management, therapeutic milieu, and referrals for services.   Melissa [name]. 10/11/2024

## 2024-10-12 MED ORDER — PRENATAL MULTIVITAMIN CH: 1.0000 | ORAL_TABLET | Freq: Every day | ORAL | Status: DC

## 2024-10-12 MED ORDER — PRENATAL MULTIVITAMIN CH
1.0000 | ORAL_TABLET | Freq: Every day | ORAL | Status: DC
  Administered 2024-10-12 – 2024-10-13 (×2): 1 via ORAL

## 2024-10-12 MED ORDER — POLYETHYLENE GLYCOL 3350 17 G PO PACK
17.0000 g | PACK | Freq: Once | ORAL | Status: DC
  Filled 2024-10-12: qty 1

## 2024-10-12 NOTE — Progress Notes (Signed)
 Tour of Duty:  Prentice JINNY Angle, RN, 10/12/24, Tour of Duty: 0700-1900  SI/HI/AVH: Denies  Self-Reported   Mood: Neutral  Anxiety: Denies Depression: Denies, but Observable Irritability: Denies  Broset  Violence Prevention Guidelines *See Row Information*: Small Violence Risk interventions implemented   LBM  Last BM Date : 10/10/24   Pain: not present  Patient Refusals (including Rx): No  >>Shift Summary: Patient observed to be calm on unit. Patient able to make needs known. Patient [redacted] weeks pregnant with intentional pregnancy. Patient mildly flat and irritable during assessment. Patient observed to engage appropriately with staff and peers. Patient taking medications as prescribed. This shift, no PRN medication requested or required. No reported or observed side effects to medication. No reported or observed agitation, aggression, or other acute emotional distress. No reported or observed physical abnormalities or concerns.  Last Vitals  Vitals Weight: 45.4 kg Temp: 98.2 F (36.8 C) Temp Source: Oral Pulse Rate: 74 Resp: 17 BP: 97/62 Patient Position: (not recorded)  Admission Type  Psych Admission Type (Psych Patients Only) Admission Status: Voluntary Date 72 hour document signed : (not recorded) Time 72 hour document signed : (not recorded) Provider Notified (First and Last Name) (see details for LINK to note): (not recorded)   Psychosocial Assessment  Psychosocial Assessment Patient Complaints: None Eye Contact: Fair Facial Expression: Flat Affect: Flat, Irritable Speech: Soft Interaction: Guarded Motor Activity: Other (Comment) (WDL) Appearance/Hygiene: Unremarkable Behavior Characteristics: Cooperative Mood: Depressed   Aggressive Behavior  Targets: (not recorded)   Thought Process  Thought Process Coherency: Within Defined Limits Content: Within Defined Limits Delusions: None reported or observed Perception: Within Defined  Limits Hallucination: None reported or observed Judgment: Limited Confusion: None  Danger to Self/Others  Danger to Self Current suicidal ideation?: Denies Description of Suicide Plan: (not recorded) Self-Injurious Behavior: (not recorded) Agreement Not to Harm Self: (not recorded) Description of Agreement: (not recorded) Danger to Others: None reported or observed

## 2024-10-12 NOTE — Progress Notes (Addendum)
 [facility] MD Progress Note 10/12/2024 8:08 AM Melissa [name]  MRN:  [id number] Principal Problem: MDD (major depressive disorder) Diagnosis: Principal Problem:   MDD (major depressive disorder)   Subjective:   Melissa [name] is a 23 year old female with no significant psychiatric history but with recent medical history significant for intentional pregnancy at [redacted]w[redacted]d who is admitted to the behavioral health unit for worsening depression symptoms and suicidal ideation.   Chart Review from last 24 hours and discussion during bed progression: The patient's chart was reviewed and nursing notes were reviewed. The patient's case was discussed in multidisciplinary team meeting.   - events per chart review / staff report: none - Patient received all scheduled medications - Patient did not receive any PRN medications  Per Patient:  On assessment today, the patient reports continued improvement in her symptoms of depression, worthlessness, suicidal ideation.  She denied suicidal ideation, even passive, for the second day in a row.  She reports that her appetite is okay but she does not particular like the food here.  She reports that she is having trouble sleeping but is unclear of the reason.  She thinks it may have something to do with the uncomfortable beds.  Spent majority of our time together conducting psychoeducation and CBT on the challenges associated with cognitive distortions, most especially catastrophizing.  Left patient with handout.  Also spoke at length about the value of finding pregnancy support groups.  Sleep: Fair Appetite:  Fair Depression: Better  Anxiety: Somewhat lower Auditory Hallucinations: denies, not seen responding Visual Hallucinations: denies, not seen responding Paranoia: denies HI: denies SI: denies passive SI for first time Side effects from medications: does not endorse any side-effects they attribute to medications. Other concerns discussed with  patient:  Review of Systems  Constitutional:  Positive for malaise/fatigue and weight loss. Negative for chills and fever.  Respiratory:  Negative for shortness of breath.   Cardiovascular:  Negative for chest pain.  Gastrointestinal:  Negative for abdominal pain, constipation, diarrhea, nausea and vomiting.  Genitourinary:  Negative for dysuria and urgency.  Neurological:  Negative for headaches.  Psychiatric/Behavioral:  Positive for depression. Negative for hallucinations, substance abuse and suicidal ideas. The patient is not nervous/anxious.     Total time spent with patient: 30 minutes  HX from H&P: Past Psychiatric Hx: Previous Psych Diagnoses: None Prior inpatient treatment: None Current/prior outpatient treatment: saw a clinical biochemist when younger, no formal dx Prior rehab hx: denies Psychotherapy hx:no History of suicide: no History of homicide or aggression: None Psychiatric medication history: None Psychiatric medication compliance history: N/A Neuromodulation history: None Current Psychiatrist: None Current therapist: None   Substance Abuse Hx: Alcohol: No  Tobacco: no history Illicit drugs:  Marijuana/THC: used to smoke, quit 4 years ago prior to pregnancy  Other: no hx of other drugs   Past Medical History: Medical Diagnoses: 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 Adopted by Aunt/uncle at age 38.  Graduated high school in [place]. No  school afterwards. Grades were good, no IEP. No behavioral issues. Abuse: Patient denies, but avoids eye contact on this question Marital Status: single, never married. Recent relationship breakup Sexual orientation: prefers men Children: 67 year old son [name], currently with his father Employment: Print production planner at [employer] Peer Group: Denies close friends Housing: Has her own apartment Finances: strained Legal: no legal issues or pending court cases Military: no affiliation   Past Medical History (auto-populated):  Past Medical History:  Diagnosis Date   Medical history non-contributory    Seasonal allergies     Past Surgical History:  Procedure Laterality Date   NO PAST SURGERIES      Social History:  Social History   Substance and Sexual Activity  Alcohol Use No     Social History   Substance and Sexual Activity  Drug Use No    Social History   Socioeconomic History   Marital status: Single    Spouse name: Not on file   Number of children: 1   Years of education: Not on file   Highest education level: Not on file  Occupational History   Occupation: Dietary Aid  Tobacco Use   Smoking status: Never   Smokeless tobacco: Never  Vaping Use   Vaping status: Never Used  Substance and Sexual Activity   Alcohol use: No   Drug use: No   Sexual activity: Yes    Birth control/protection: None  Other Topics Concern   Not on file  Social History Narrative   Not on file   Social Drivers of Health   Financial Resource Strain: Not on File (09/17/2022)   Received from [facility]    Financial Resource Strain: 0  Food Insecurity: No Food Insecurity (10/10/2024)   Hunger Vital Sign    Worried About Running Out of Food in the Last Year: Never true    Ran Out of Food in the Last Year: Never true  Transportation Needs: No Transportation Needs (10/10/2024)   PRAPARE - Therapist, Art (Medical): No    Lack of Transportation (Non-Medical): No  Physical Activity: Not on File (09/17/2022)   Received from [facility]   Physical Activity    Physical Activity: 0  Stress: Not on File (03/25/2022)   Received from [facility]   Stress    Stress: 0  Social Connections: Not on File (08/15/2023)   Received from [facility]   Social Connections    Connectedness: 0   Additional Social History:  Objective: Medications, Labs, Mental Status Exam, Physical Exam Current Medications: Current Facility-Administered Medications  Medication Dose Route Frequency Provider Last Rate Last Admin   acetaminophen  (TYLENOL ) tablet 650 mg  650 mg Oral Q6H PRN Motley-Mangrum, Jadeka A, PMHNP       alum & mag hydroxide-simeth (MAALOX/MYLANTA) 200-200-20 MG/5ML suspension 30 mL  30 mL Oral Q4H PRN Motley-Mangrum, Jadeka A, PMHNP       cholecalciferol (VITAMIN D3) 10 MCG (400 UNIT) tablet 400 Units  400 Units Oral Daily Delsie Lynwood Morene Lavone, MD   400 Units at 10/11/24 1254   feeding supplement (ENSURE PLUS HIGH PROTEIN) liquid 237 mL  237 mL Oral TID BM Delsie Lynwood Morene Lavone, MD   237 mL at 10/11/24 2121   magnesium hydroxide (MILK OF MAGNESIA) suspension 30 mL  30 mL Oral Daily PRN Motley-Mangrum, Jadeka A, PMHNP       prenatal vitamin w/FE, FA (NATACHEW) chewable tablet 1 tablet  1 tablet  Oral Q1200 Delsie Lynwood Morene Lavone, MD       Vitamin D (Ergocalciferol) (DRISDOL) 1.25 MG (50000 UNIT) capsule 50,000 Units  50,000 Units Oral Q7 days Delsie Lynwood Morene Lavone, MD        Lab Results:  Results for orders placed or performed during the hospital encounter of 10/10/24 (from the past 48 hours)  Ferritin     Status: None   Collection Time: 10/10/24  6:49 PM  Result Value Ref Range   Ferritin 18 11 - 307 ng/mL    Comment: Performed at [facility], [address], [place], [place] [address]  Vitamin B12     Status: None   Collection Time:  10/10/24  6:49 PM  Result Value Ref Range   Vitamin B-12 371 180 - 914 pg/mL    Comment: Performed at [facility], [address], [place], [place] [address]  Folate     Status: None   Collection Time: 10/10/24  6:49 PM  Result Value Ref Range   Folate 12.8 >5.9 ng/mL    Comment: Performed at [facility], [address] [place], [place] [address]  VITAMIN D 25 Hydroxy (Vit-D Deficiency, Fractures)     Status: Abnormal   Collection Time: 10/10/24  6:49 PM  Result Value Ref Range   Vit D, 25-Hydroxy 6.05 (L) 30 - 100 ng/mL    Comment: (NOTE) Vitamin D deficiency has been defined by the Institute of Medicine  and an Endocrine Society practice guideline as a level of serum 25-OH  vitamin D less than 20 ng/mL (1,2). The Endocrine Society went on to  further define vitamin D insufficiency as a level between 21 and 29  ng/mL (2).  1. IOM (Institute of Medicine). 2010. Dietary reference intakes for  calcium and D. [place]: [employer]. 2. Holick MF, Binkley DeWitt, Bischoff-Ferrari HA, et al. Evaluation,  treatment, and prevention of vitamin D deficiency: an Endocrine  Society clinical practice guideline, JCEM. 2011 Jul; 96(7): 1911-30.  Performed at [facility], [address] [place], [place] [address]   TSH     Status: None   Collection Time: 10/10/24  6:49 PM  Result Value Ref Range   TSH 0.363 0.350 - 4.500 uIU/mL    Comment: Performed at [facility], [address] [place], [place] [address]    Blood Alcohol level:  Lab Results  Component Value Date   [facility] <15 10/09/2024    Metabolic Disorder Labs: Lab Results  Component Value Date   HGBA1C 5.0 10/04/2024   No results found for: PROLACTIN No results found for: CHOL, TRIG, HDL, CHOLHDL, VLDL, LDLCALC  Physical Findings: AIMS:  , ,  ,  ,    CIWA:    COWS:     Musculoskeletal: Strength & Muscle Tone: within normal  limits Gait & Station: normal Patient leans: N/A  Mental Status Exam Appearance: Thin, tired appearing african american female with hair that is long and unbound.   Behavior: Withdrawn, guarded, depressed  Attitude: Cooperative but minimally interactive  Speech: Slow, sparse, quiet  Mood: Depressed  Affect: Congruent. Depressed, flat.  Thought Process: clear, coherent, linear  Thought Content: denies paranoia  SI/HI: Endorses SI but will not share plan  Perceptions: denies hallucinations, not seen responding  Judgement: Fair  Insight: Fair  Fund of Knowledge: WNL         Physical Exam: Physical Exam HENT:     Head:  Normocephalic and atraumatic.     Nose: Nose normal.  Neurological:     Mental Status: She is alert and oriented to person, place, and time.  Psychiatric:        Attention and Perception: Attention and perception normal.        Mood and Affect: Mood is depressed. Affect is flat.        Speech: Speech is delayed.        Behavior: Behavior is slowed and withdrawn. Behavior is cooperative.        Thought Content: Thought content is not paranoid or delusional. Thought content does not include homicidal or suicidal ideation.        Cognition and Memory: Cognition and memory normal.        Judgment: Judgment normal.     Blood pressure 97/62, pulse 74, temperature 98.2 F (36.8 C), temperature source Oral, resp. rate 17, height 5' 1 (1.549 m), weight 45.4 kg, last menstrual period 07/21/2024, SpO2 100%, currently breastfeeding. Body mass index is 18.89 kg/m.  ASSESSMENT: Melissa [name] is a 23 year old female with no significant psychiatric history but with recent medical history significant for intentional pregnancy at [redacted]w[redacted]d who is admitted to the behavioral health unit for worsening depression symptoms and suicidal ideation.   Patient is currently assessed as a high risk to herself due to her constellation of depression symptoms: sudden weight loss in known,  planned pregnancy, poor appetite, extreme low mood, active suicidal ideation with unwillingness to share. Major psychosocial stressors include loss of relationship, minimal social/financial support, increased pressure at work, acute insomnia.    11/7: Started patient on Vitamin D supplement. Conducted CBT on catastrophizing, cognitive distortions. Patient seems to be benefiting from the milieu more than anything else. She is no longer isolating, and seems to be benefiting from talking with other patients and the staff. She appears better.  Diagnoses / Active Problems: Major Depressive Disorder  PLAN: Safety and Monitoring:  --  Voluntary admission to inpatient psychiatric unit for safety, stabilization and treatment  -- Daily contact with patient to assess and evaluate symptoms and progress in treatment  -- Patient's case to be discussed in multi-disciplinary team meeting  -- Observation Level : q15 minute checks  -- Vital signs:  q12 hours  -- Precautions: suicide, elopement, and assault  2. Psychiatric Diagnoses and Treatment:  Continue prenatal vitamin for nutrition support Continue vitamin D3 high dose for severe deficiency, start low dose daily d3 while inpatient to assist. Continue nutrition shakes for nutrition support.   The risks/benefits/side-effects/alternatives to this medication were discussed in detail with the patient and time was given for questions. The patient consents to medication trial.  Metabolic profile and EKG monitoring obtained while on an atypical antipsychotic Body mass index is 18.89 kg/m. Lipid Panel: Hbg A1c:  QTc: 407 ms, last obtained 10/11/2024 Encouraged patient to participate in unit milieu and in scheduled group therapies   -- Short Term Goals: Ability to identify changes in lifestyle to reduce recurrence of condition will improve, Ability to verbalize feelings will improve, Ability to disclose and discuss suicidal ideas, and Ability to identify and  develop effective coping behaviors will improve  -- Long Term Goals: Improvement in symptoms so as ready for discharge    3. Medical Issues Being Addressed:  Nutritional deficiencies Labs reviewed, notable for pregnancy status, low vitamin D, positive Kells antibody, mild anemia, otherwise unremarkable  Tobacco Use Disorder  --  Patient does not need nicotine replacement  --  Smoking cessation encouraged  4. Discharge Planning:   -- Social work and case management to assist with discharge planning and identification of hospital follow-up needs prior to discharge  -- Estimated discharge: 11/09  -- Discharge Concerns: Need to establish a safety plan; Medication compliance and effectiveness  -- Discharge Goals: Return home with outpatient referrals for mental health follow-up including medication management/psychotherapy   Lynwood Morene Lavone Delsie, MD 10/12/2024, 8:08 AM

## 2024-10-12 NOTE — Group Note (Signed)
 Date:  10/12/2024 Time:  3:51 PM  Group Topic/Focus:  Overcoming Stress:   The focus of this group is to define stress and help patients assess their triggers.    Participation Level:  Did Not Attend   Melissa [name] 10/12/2024, 3:51 PM

## 2024-10-12 NOTE — Plan of Care (Signed)
   Problem: Education: Goal: Emotional status will improve Outcome: Progressing Goal: Mental status will improve Outcome: Progressing

## 2024-10-12 NOTE — BHH Suicide Risk Assessment (Signed)
 B[facility] INPATIENT:  Family/Significant Other Suicide Prevention Education  Suicide Prevention Education:  Education Completed;  Lucy Cutter (child's father) [phone],  has been identified by the patient as the family member/significant other with whom the patient will be residing, and identified as the person(s) who will aid the patient in the event of a mental health crisis (suicidal ideations/suicide attempt).  With written consent from the patient, the family member/significant other has been provided the following suicide prevention education, prior to the and/or following the discharge of the patient.  The suicide prevention education provided includes the following: Suicide risk factors Suicide prevention and interventions National Suicide Hotline telephone number [facility] assessment telephone number [facility] Emergency Assistance 911 [facility] and/or Residential Mobile Crisis Unit telephone number  Request made of family/significant other to: Remove weapons (e.g., guns, rifles, knives), all items previously/currently identified as safety concern.   Remove drugs/medications (over-the-counter, prescriptions, illicit drugs), all items previously/currently identified as a safety concern.  The family member/significant other verbalizes understanding of the suicide prevention education information provided.  The family member/significant other agrees to remove the items of safety concern listed above.  Roselyn GORMAN Lento 10/12/2024, 12:13 PM

## 2024-10-12 NOTE — Group Note (Signed)
 Date:  10/12/2024 Time:  1:13 PM  Group Topic/Focus: Recreational Therapy Patient use their thinking skills to guess the lyrics of the song     Participation Level:  Did Not Attend   Additional Comments:  Patient did not attend  Melissa [name] 10/12/2024, 1:13 PM

## 2024-10-12 NOTE — Group Note (Signed)
 Date:  10/12/2024 Time:  10:53 AM  Group Topic/Focus: Self-Regulation assessment and goals orientation  Goals Group:   The focus of this group is to help patients establish daily goals to achieve during treatment and discuss how the patient can incorporate goal setting into their daily lives to aide in recovery. Orientation:   The focus of this group is to educate the patient on the purpose and policies of crisis stabilization and provide a format to answer questions about their admission.  The group details unit policies and expectations of patients while admitted.    Participation Level:  Active  Participation Quality:  Appropriate  Affect:  Appropriate  Cognitive:  Appropriate  Insight: Appropriate  Engagement in Group:  Engaged  Modes of Intervention:  Orientation  Additional Comments:  Patient did attend and was engaged with activity.  Delancey Moraes M Amyria Komar 10/12/2024, 10:53 AM

## 2024-10-12 NOTE — Progress Notes (Signed)
 [facility] MD Progress Note  10/12/2024 3:36 PM Melissa [name]  MRN:  [id number] Principal Problem: MDD (major depressive disorder) Diagnosis: Principal Problem:   MDD (major depressive disorder)   Subjective:   Melissa [name] is a 23 year old female with no significant psychiatric history but with recent medical history significant for intentional pregnancy at [redacted]w[redacted]d who is admitted to the behavioral health unit for worsening depression symptoms and suicidal ideation.   Overnight events: Slept 8.25 hours. Denied SI, HI, AVH. No disturbances. No PRNs.   Per Patient: Patient is feeling better today from yesterday. Brief supportive therapy provided. Is feeling anxious about discharge, getting back into the routines of her life but overall feels very good about this hospitalization. Is working on her skills managing episodes of acute anxiety. Is interested in being set up with outpatient therapy. Only anxiety is really speaking with her son, requested that we get the phone number for her son's father, so that he could contact her son through him. Denied SI, HI, AVH. Is eating much better and tolerating the ensure, even though she does not like it necessarily. Still declined initiation of medication.   Review of Systems  Constitutional:  Positive for malaise/fatigue and weight loss. Negative for chills and fever.  Respiratory:  Negative for shortness of breath.   Cardiovascular:  Negative for chest pain.  Gastrointestinal:  Negative for abdominal pain, constipation, diarrhea, nausea and vomiting.  Genitourinary:  Negative for dysuria and urgency.  Neurological:  Negative for headaches.  Psychiatric/Behavioral:  Positive for depression. Negative for hallucinations, substance abuse and suicidal ideas. The patient is not nervous/anxious.        Depression improving    Total time spent with patient: 30 minutes  HX from H&P: Past Psychiatric Hx: Previous Psych Diagnoses: None Prior inpatient  treatment: None Current/prior outpatient treatment: saw a clinical biochemist when younger, no formal dx Prior rehab hx: denies Psychotherapy hx:no History of suicide: no History of homicide or aggression: None Psychiatric medication history: None Psychiatric medication compliance history: N/A Neuromodulation history: None Current Psychiatrist: None Current therapist: None   Substance Abuse Hx: Alcohol: No  Tobacco: no history Illicit drugs:  Marijuana/THC: used to smoke, quit 4 years ago prior to pregnancy  Other: no hx of other drugs   Past Medical History: Medical Diagnoses: 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 Adopted by Aunt/uncle at age 23. Graduated high school in [place]. No school afterwards. Grades were good, no IEP. No behavioral issues. Abuse: Patient denies, but avoids eye contact on this question Marital Status: single, never married. Recent relationship breakup Sexual orientation: prefers men Children: 34 year old son [name], currently with his father Employment: Print production planner at [employer] Peer Group: Denies close friends Housing: Has her own apartment Finances: strained Legal: no legal issues or pending court cases Military: no affiliation   Past Medical History (auto-populated):  Past Medical History:  Diagnosis Date   Medical history non-contributory    Seasonal allergies  Past Surgical History:  Procedure Laterality Date   NO PAST SURGERIES      Social History:  Social History   Substance and Sexual Activity  Alcohol Use No     Social History   Substance and Sexual Activity  Drug Use No    Social History   Socioeconomic History   Marital status: Single    Spouse name: Not on file   Number of children: 1   Years of education: Not on file   Highest education level: Not on file  Occupational History   Occupation: Dietary Aid  Tobacco Use   Smoking status: Never   Smokeless tobacco: Never  Vaping Use   Vaping status: Never Used  Substance and Sexual Activity   Alcohol use: No   Drug use: No   Sexual activity: Yes    Birth control/protection: None  Other Topics Concern   Not on file  Social History Narrative   Not on file   Social Drivers of Health   Financial Resource Strain: Not on File (09/17/2022)   Received from [facility]    Financial Resource Strain: 0  Food Insecurity: No Food Insecurity (10/10/2024)   Hunger Vital Sign    Worried About Running Out of Food in the Last Year: Never true    Ran Out of Food in the Last Year: Never true  Transportation Needs: No Transportation Needs (10/10/2024)   PRAPARE - Administrator, Civil Service (Medical): No    Lack of Transportation (Non-Medical): No  Physical Activity: Not on File (09/17/2022)   Received from [facility]   Physical Activity    Physical Activity: 0  Stress: Not on File (03/25/2022)   Received from [facility]   Stress    Stress: 0  Social Connections: Not on File (08/15/2023)   Received from [facility]   Social Connections    Connectedness: 0   Additional Social History:  Objective: Medications, Labs, Mental Status Exam, Physical Exam Current Medications: Current Facility-Administered Medications  Medication Dose Route Frequency Provider Last Rate Last Admin   acetaminophen  (TYLENOL ) tablet 650 mg  650 mg Oral Q6H PRN Motley-Mangrum,  Jadeka A, PMHNP       alum & mag hydroxide-simeth (MAALOX/MYLANTA) 200-200-20 MG/5ML suspension 30 mL  30 mL Oral Q4H PRN Motley-Mangrum, Jadeka A, PMHNP       cholecalciferol (VITAMIN D3) 10 MCG (400 UNIT) tablet 400 Units  400 Units Oral Daily Delsie Lynwood Morene Lavone, MD   400 Units at 10/12/24 0928   feeding supplement (ENSURE PLUS HIGH PROTEIN) liquid 237 mL  237 mL Oral TID BM Delsie Lynwood Morene Lavone, MD   237 mL at 10/12/24 0928   magnesium hydroxide (MILK OF MAGNESIA) suspension 30 mL  30 mL Oral Daily PRN Motley-Mangrum, Jadeka A, PMHNP       prenatal multivitamin tablet 1 tablet  1 tablet Oral Q1200 Delsie Lynwood Morene Lavone, MD   1 tablet at 10/12/24 0928   Vitamin D (Ergocalciferol) (DRISDOL) 1.25 MG (50000 UNIT) capsule 50,000 Units  50,000 Units Oral Q7 days Delsie Lynwood Morene Lavone, MD        Lab Results:  Results for orders placed or performed during the hospital encounter of 10/10/24 (from the past 48 hours)  Ferritin     Status: None   Collection Time: 10/10/24  6:49 PM  Result Value Ref Range   Ferritin 18 11 - 307 ng/mL    Comment: Performed at [facility], [address] [place], [place] [address]  Vitamin B12     Status: None   Collection Time: 10/10/24  6:49 PM  Result Value Ref Range   Vitamin B-12 371 180 - 914 pg/mL    Comment: Performed at [facility], [address], [place], [place] [address]  Folate     Status: None   Collection Time: 10/10/24  6:49 PM  Result Value Ref Range   Folate 12.8 >5.9 ng/mL    Comment: Performed at [facility], [address] [place], [place] [address]  VITAMIN D 25 Hydroxy (Vit-D Deficiency, Fractures)     Status: Abnormal   Collection Time: 10/10/24  6:49 PM  Result Value Ref Range   Vit D, 25-Hydroxy 6.05 (L) 30 - 100 ng/mL    Comment: (NOTE) Vitamin D deficiency has been defined by the Institute of Medicine  and an Endocrine Society  practice guideline as a level of serum 25-OH  vitamin D less than 20 ng/mL (1,2). The Endocrine Society went on to  further define vitamin D insufficiency as a level between 21 and 29  ng/mL (2).  1. IOM (Institute of Medicine). 2010. Dietary reference intakes for  calcium and D. [place]: [employer]. 2. Holick MF, Binkley [place], Bischoff-Ferrari HA, et al. Evaluation,  treatment, and prevention of vitamin D deficiency: an Endocrine  Society clinical practice guideline, JCEM. 2011 Jul; 96(7): 1911-30.  Performed at [facility], [address] [place], [place] [address]   TSH     Status: None   Collection Time: 10/10/24  6:49 PM  Result Value Ref Range   TSH 0.363 0.350 - 4.500 uIU/mL    Comment: Performed at [facility], [address] [place], [place] [address]    Blood Alcohol level:  Lab Results  Component Value Date   [facility] <15 10/09/2024    Metabolic Disorder Labs: Lab Results  Component Value Date   HGBA1C 5.0 10/04/2024   No results found for: PROLACTIN No results found for: CHOL, TRIG, HDL, CHOLHDL, VLDL, LDLCALC  Physical Findings: AIMS:  , ,  ,  ,    CIWA:    COWS:     Musculoskeletal: Strength & Muscle Tone: within normal limits Gait & Station: normal Patient leans: N/A  Mental Status Exam Appearance: Thin, tired appearing african american female with hair that is long and unbound.   Behavior: More interactive  Attitude: Appears to be improved from previous exam  Speech: A little soft but normal prosody, tone,   Mood: Much better  Affect: Euthymic, brightens appropriately   Thought Process: clear, coherent, linear  Thought Content: denies paranoia  SI/HI: Denies suicidal ideation today  Perceptions: denies hallucinations, not seen responding  Judgement: Fair  Insight: Fair  Fund of Knowledge: WNL         Physical Exam: Physical Exam HENT:     Head: Normocephalic and atraumatic.      Nose: Nose normal.  Neurological:     Mental Status: She is alert and oriented to person, place, and time.  Psychiatric:        Attention and Perception: Attention and perception normal.        Mood and Affect: Mood is depressed. Affect is not flat.        Speech: Speech normal. Speech is not delayed.        Behavior: Behavior normal. Behavior is not slowed or withdrawn. Behavior is cooperative.  Thought Content: Thought content is not paranoid or delusional. Thought content does not include homicidal or suicidal ideation.        Cognition and Memory: Cognition and memory normal.        Judgment: Judgment normal.     Blood pressure 97/62, pulse 74, temperature 98.2 F (36.8 C), temperature source Oral, resp. rate 17, height 5' 1 (1.549 m), weight 45.4 kg, last menstrual period 07/21/2024, SpO2 100%, currently breastfeeding. Body mass index is 18.89 kg/m.  ASSESSMENT: Melissa [name] is a 23 year old female with no significant psychiatric history but with recent medical history significant for intentional pregnancy at [redacted]w[redacted]d who is admitted to the behavioral health unit for worsening depression symptoms and suicidal ideation.   Patient is currently assessed as a high risk to herself due to her constellation of depression symptoms: sudden weight loss in known, planned pregnancy, poor appetite, extreme low mood, active suicidal ideation with unwillingness to share. Major psychosocial stressors include loss of relationship, minimal social/financial support, increased pressure at work, acute insomnia.    11/7: Started patient on Vitamin D supplement. Conducted CBT on catastrophizing, cognitive distortions. Patient seems to be benefiting from the milieu more than anything else. She is no longer isolating, and seems to be benefiting from talking with other patients and the staff. She appears better.  11/8: Patient appears almost euthymic. Brightens appropriately. Provided more CBT today.  Sleeping and eating better -- I believe this has also significantly contributed to patient's improved functioning. Denied suicidal ideation to me, has distinctive protective factors in son and new pregnancy. I believe has seen real therapeutic benefit from hospital stay. Declined medications which is OK given improvement. Would be appropriate for therapeutic follow-up and discharge tomorrow 11/9 if no changes occur.   Diagnoses / Active Problems: Major Depressive Disorder  PLAN: Safety and Monitoring:  --  Voluntary admission to inpatient psychiatric unit for safety, stabilization and treatment  -- Daily contact with patient to assess and evaluate symptoms and progress in treatment  -- Patient's case to be discussed in multi-disciplinary team meeting  -- Observation Level : q15 minute checks  -- Vital signs:  q12 hours  -- Precautions: suicide, elopement, and assault  2. Psychiatric Diagnoses and Treatment:  Continue prenatal vitamin for nutrition support Continue vitamin D3 high dose for severe deficiency, start low dose daily d3 while inpatient to assist. Continue nutrition shakes for nutrition support.   The risks/benefits/side-effects/alternatives to this medication were discussed in detail with the patient and time was given for questions. The patient consents to medication trial.  Metabolic profile and EKG monitoring obtained while on an atypical antipsychotic Body mass index is 18.89 kg/m. Lipid Panel: Hbg A1c:  QTc: 407 ms, last obtained 10/11/2024 Encouraged patient to participate in unit milieu and in scheduled group therapies   -- Short Term Goals: Ability to identify changes in lifestyle to reduce recurrence of condition will improve, Ability to verbalize feelings will improve, Ability to disclose and discuss suicidal ideas, and Ability to identify and develop effective coping behaviors will improve  -- Long Term Goals: Improvement in symptoms so as ready for discharge    3.  Medical Issues Being Addressed:  Nutritional deficiencies Labs reviewed, notable for pregnancy status, low vitamin D, positive Kells antibody, mild anemia, otherwise unremarkable  Tobacco Use Disorder  --  Patient does not need nicotine replacement  -- Smoking cessation encouraged  4. Discharge Planning:   -- Social work and case management to assist with discharge planning  and identification of hospital follow-up needs prior to discharge  -- Estimated discharge: 11/09  -- Discharge Concerns: Need to establish a safety plan; Medication compliance and effectiveness  -- Discharge Goals: Return home with outpatient referrals for mental health follow-up including medication management/psychotherapy   Kalkidan Caudell, MD 10/12/2024, 3:36 PM

## 2024-10-12 NOTE — Group Note (Signed)
 Recreation Therapy Group Note   Group Topic:Leisure Education  Group Date: 10/12/2024 Start Time: 0940 End Time: 1012 Facilitators: Thurley Francesconi-McCall, LRT,CTRS Location: [address]   Group Topic: Leisure Education  Goal Area(s) Addresses:  Patient will identify positive leisure activities.  Patient will identify one positive benefit of participation in leisure activities.   Behavioral Response:   Intervention: Leisure Group Game  Activity: Patient, MHT, and LRT participated in playing a trivia game of Guess the [place]. Teams took turns spinning the wheel. Whatever category ([address], Pop, Dance, Hip Hop, Indie and R&B) the wheel landed on, LRT read the lyric and that team would guess the missing lyric. The team with the most points wins the game.  Education:  Leisure Exposure, Pharmacist, Community, Discharge Planning  Education Outcome: Acknowledges education/In group clarification offered/Needs additional education   Affect/Mood: N/A   Participation Level: Did not attend    Clinical Observations/Individualized Feedback:     Plan: Continue to engage patient in RT group sessions 2-3x/week.   Abimael Zeiter-McCall, LRT,CTRS 10/12/2024 12:04 PM

## 2024-10-12 NOTE — Plan of Care (Signed)
   Problem: Education: Goal: Knowledge of [facility] Education information/materials will improve Outcome: Progressing Goal: Emotional status will improve Outcome: Progressing Goal: Mental status will improve Outcome: Progressing

## 2024-10-13 NOTE — Group Note (Signed)
 Date:  10/13/2024 Time:  12:02 PM  Group Topic/Focus: Emotional Education   The group viewed a TED Talk on resilience, highlighting coping with adversity and cultivating personal strengths. The discussion provided a safe space for patients to reflect on past adverse events and explore strategies for managing challenges, focusing on controllable factors, and drawing on personal strengths and support. The group reinforced the concept that resilience is a skill that can be learned and practiced.   Participation Level:  Minimal  Participation Quality:  Appropriate, Attentive, and Resistant  Affect:  Appropriate  Cognitive:  Alert and Appropriate  Insight: None  Engagement in Group:  Engaged, Improving, and Resistant  Modes of Intervention:  Discussion, Education, Exploration, Problem-solving, and Support  Additional Comments:  Melissa [name] attended and minimally participated during this wellness group. She was very attentive during the ted talk and was respectful toward her peers when we debriefed. She would nod her head to what I would say but when I would encourage her to speak out, she would smile and shake her head in declination.  [name] 10/13/2024, 12:02 PM

## 2024-10-13 NOTE — Progress Notes (Signed)
(  Sleep Hours) - (Any PRNs that were needed, meds refused, or side effects to meds)- none (Any disturbances and when (visitation, over night)- none (Concerns raised by the patient)- none (SI/HI/AVH)- denies

## 2024-10-13 NOTE — Group Note (Signed)
 Date:  10/13/2024 Time:  8:41 PM  Group Topic/Focus:  Wrap-Up Group:   The focus of this group is to help patients review their daily goal of treatment and discuss progress on daily workbooks.    Participation Level:  Minimal  Participation Quality:  Attentive  Affect:  Appropriate  Cognitive:  Appropriate  Insight: Improving  Engagement in Group:  Limited  Modes of Intervention:  Discussion  Additional Comments:  Pt attended the evening wrap-up group. Tech introduced the staff for the evening, reminded group of the evening schedule and reminded them to ask for anything they need. Pt read and discussed a condensed version of the story named [employer]. The pt listened and nodded in agreement.  Melissa [name] 10/13/2024, 8:41 PM

## 2024-10-13 NOTE — Group Note (Signed)
 [facility] LCSW Group Therapy Note   Group Date: 10/13/2024 Start Time: 1000 End Time: 1058   Type of Therapy/Topic:  Group Therapy:  Balance in Life  Participation Level:  Minimal   Description of Group:    This group will address the concept of balance and how it feels and looks when one is unbalanced. Patients will be encouraged to process areas in their lives that are out of balance, and identify reasons for remaining unbalanced. Facilitators will guide patients utilizing problem- solving interventions to address and correct the stressor making their life unbalanced. Understanding and applying boundaries will be explored and addressed for obtaining  and maintaining a balanced life. Patients will be encouraged to explore ways to assertively make their unbalanced needs known to significant others in their lives, using other group members and facilitator for support and feedback.  Therapeutic Goals: Patient will identify two or more emotions or situations they have that consume much of in their lives. Patient will identify signs/triggers that life has become out of balance:  Patient will identify two ways to set boundaries in order to achieve balance in their lives:  Patient will demonstrate ability to communicate their needs through discussion and/or role plays  Summary of Patient Progress:        Pt was present and able to identify two or more emotions  signs/triggers that life has become out of balance. Patient was able to identify ways to set boundaries in order to achieve balance in their lives  Patient demonstrated the ability to communicate needs through discussion.    Therapeutic Modalities:   Cognitive Behavioral Therapy Solution-Focused Therapy Assertiveness Training   Golda Louder, LCSW

## 2024-10-13 NOTE — Plan of Care (Signed)
   Problem: Education: Goal: Emotional status will improve Outcome: Progressing Goal: Mental status will improve Outcome: Progressing

## 2024-10-13 NOTE — [facility] Group Notes (Signed)
 Melissa [name] attended the social work group today 10/13/24 from 1000-1100.

## 2024-10-13 NOTE — Group Note (Signed)
 Date:  10/13/2024 Time:  3:40 PM  Group Topic/Focus: Self confidence and peer support  This group participated in a music therapy session utilizing karaoke to promote self-expression, mood elevation, and social connection. The session provided a supportive and safe environment for patients to engage creatively through singing and listening to music. The activity encouraged self-confidence, peer support, and positive social interaction.    Participation Level:  None  Participation Quality:  Appropriate, Attentive, and Supportive  Affect:  Flat and Resistant  Cognitive:  Alert and Appropriate  Insight: Improving and Lacking  Engagement in Group:  Improving, Poor, and Supportive  Modes of Intervention:  Activity, Socialization, and Support  Additional Comments:  Melissa [name] attended this group but declined to participate in [name]. She quietly watched her peers sing and clapped along in support.  [name] HERO Shantrice Rodenberg 10/13/2024, 3:40 PM

## 2024-10-13 NOTE — [facility] Group Notes (Signed)
 Melissa [name] returned to me her completed SMART goals worksheet from goals group.

## 2024-10-13 NOTE — [facility] Group Notes (Signed)
 [facility] Group Notes:  (Nursing/MHT/Case Management/Adjunct)  Date:  10/13/2024  Time:  4:38 AM  Type of Therapy:  Wrap-up group  Participation Level:  Active  Participation Quality:  Appropriate  Affect:  Appropriate  Cognitive:  Appropriate  Insight:  Appropriate  Engagement in Group:  Engaged  Modes of Intervention:  Education  Summary of Progress/Problems: Goal to socialize. Rated day 9/10.  Melissa [name] [place] 10/13/2024, 4:38 AM

## 2024-10-13 NOTE — Plan of Care (Signed)
   Problem: Education: Goal: Knowledge of [place] [facility] Education information/materials will improve Outcome: Progressing Goal: Emotional status will improve Outcome: Progressing

## 2024-10-13 NOTE — Group Note (Signed)
 Date:  10/13/2024 Time:  9:29 AM  Group Topic/Focus: Goals Group  Patients participated in a goals group focused on setting SMART goals related to recovery. The group began with an psychiatrist using a beach ball with questions to promote engagement and build rapport. Patients then reviewed the components of SMART goals and completed a goals worksheet individually. Participants were encouraged to share their personal SMART goals with the group to promote insight, accountability, and peer support.  Participation Level:  Minimal  Participation Quality:  Appropriate and Attentive  Affect:  Appropriate  Cognitive:  Alert and Appropriate  Insight: None  Engagement in Group:  Engaged and Improving  Modes of Intervention:  Discussion, Exploration, and Socialization  Additional Comments:  Melissa [name] attended goals group initially. She participated in the icebreaker and then got pulled by her care team and did not return to the group.  [name] HERO [name] 10/13/2024, 9:29 AM

## 2024-10-13 NOTE — Group Note (Signed)
 Date:  10/13/2024 Time:  5:46 PM  Group Topic/Focus:  Dimensions of Wellness:   The focus of this group is to introduce the topic of wellness using collage as a creative outlet for expressing emotions, reducing anxiety, fostering group cohesion , and enabling personal insight and healing.    Participation Level:  Active  Participation Quality:  Appropriate and Attentive  Affect:  Appropriate  Cognitive:  Alert and Appropriate  Insight: Appropriate and Good  Engagement in Group:  Engaged  Modes of Intervention:  Activity, Discussion, Exploration, Rapport Building, Socialization, and Support    Berwyn GORMAN Acosta 10/13/2024, 5:46 PM

## 2024-10-13 NOTE — Progress Notes (Signed)
 Tour of Duty:  Prentice JINNY Angle, RN, 10/13/24, Tour of Duty: 0700-1900  SI/HI/AVH: Denies  Self-Reported   Mood: Neutral  Anxiety: Denies Depression: Denies Irritability: Denies  Broset  Violence Prevention Guidelines *See Row Information*: Small Violence Risk interventions implemented   LBM  Last BM Date : 10/12/24   Pain: not present  Patient Refusals (including Rx): No  >>Shift Summary: Patient observed to be calm on unit. Patient able to make needs known. Patient observed to engage appropriately with staff and peers, though cautious with staff. Patient taking medications as prescribed. This shift, no PRN medication requested or required. No reported or observed side effects to medication. No reported or observed agitation, aggression, or other acute emotional distress. No reported or observed physical abnormalities or concerns.  Last Vitals  Vitals Weight: 45.4 kg Temp: 98.4 F (36.9 C) Temp Source: Oral Pulse Rate: 80 Resp: 20 BP: 108/75 Patient Position: (not recorded)  Admission Type  Psych Admission Type (Psych Patients Only) Admission Status: Voluntary Date 72 hour document signed : (not recorded) Time 72 hour document signed : (not recorded) Provider Notified (First and Last Name) (see details for LINK to note): (not recorded)   Psychosocial Assessment  Psychosocial Assessment Patient Complaints: None Eye Contact: Fair Facial Expression: Flat Affect: Flat Speech: Soft Interaction: Guarded Motor Activity: Other (Comment) (WDL) Appearance/Hygiene: Unremarkable Behavior Characteristics: Cooperative Mood: Pleasant   Aggressive Behavior  Targets: (not recorded)   Thought Process  Thought Process Coherency: Within Defined Limits Content: Within Defined Limits Delusions: None reported or observed Perception: Within Defined Limits Hallucination: None reported or observed Judgment: Limited Confusion: None  Danger to Self/Others  Danger to  Self Current suicidal ideation?: Denies Description of Suicide Plan: (not recorded) Self-Injurious Behavior: (not recorded) Agreement Not to Harm Self: (not recorded) Description of Agreement: (not recorded) Danger to Others: None reported or observed

## 2024-10-13 NOTE — Progress Notes (Signed)
(  Sleep Hours) -8.25 (Any PRNs that were needed, meds refused, or side effects to meds)- none (Any disturbances and when (visitation, over night)-none (Concerns raised by the patient)- none (SI/HI/AVH)-denies all

## 2024-10-14 DIAGNOSIS — F332 Major depressive disorder, recurrent severe without psychotic features: Secondary | ICD-10-CM | POA: Diagnosis present

## 2024-10-14 DIAGNOSIS — F329 Major depressive disorder, single episode, unspecified: Secondary | ICD-10-CM

## 2024-10-14 MED ORDER — VITAMIN D (ERGOCALCIFEROL) 1.25 MG (50000 UNIT) PO CAPS: 50000.0000 [IU] | ORAL_CAPSULE | ORAL | 0 refills | Status: AC

## 2024-10-14 MED ORDER — DIPHENHYDRAMINE HCL 25 MG PO CAPS: 50.0000 mg | ORAL_CAPSULE | Freq: Three times a day (TID) | ORAL | Status: DC | PRN

## 2024-10-14 MED ORDER — ONDANSETRON 4 MG PO TBDP: 4.0000 mg | ORAL_TABLET | Freq: Four times a day (QID) | ORAL | 4 refills | Status: AC | PRN

## 2024-10-14 MED ORDER — PRENATAL MULTIVITAMIN CH
1.0000 | ORAL_TABLET | Freq: Every day | ORAL | 1 refills | Status: AC
Start: 2024-10-14 — End: ?

## 2024-10-14 MED ORDER — HALOPERIDOL 5 MG PO TABS: 5.0000 mg | ORAL_TABLET | Freq: Three times a day (TID) | ORAL | Status: DC | PRN

## 2024-10-14 NOTE — Progress Notes (Signed)
 Patient discharged off unit at 1115. Patient belongings reviewed and acknowledged by patient. AVS and Transition Record reviewed and acknowledged by patient. Safety plan completed by patient, reviewed by nurse with patient and copy provided. Any medications and or prescriptions necessary for discharge addressed and provided to patient. Patient denies SI, plan or intent. Denies HI. Denies AVH. No observed or reported side effects to medication. No observed or reported agitation, aggression, or other acute emotional distress. No reported or observed physical abnormalities or concerns. Patient transportation from facility verified and observed.

## 2024-10-14 NOTE — Group Note (Signed)
 Date:  10/14/2024 Time:  10:55 AM  Group Topic/Focus:  Goals Group:   The focus of this group is to help patients establish daily goals to achieve during treatment and discuss how the patient can incorporate goal setting into their daily lives to aide in recovery.    Participation Level:  Active  Participation Quality:  Appropriate  Affect:  Appropriate  Cognitive:  Appropriate  Insight: Appropriate  Engagement in Group:  Engaged  Modes of Intervention:  Discussion  Additional Comments:  to find a hobby, getting things back in order  Nat Rummer 10/14/2024, 10:55 AM

## 2024-10-14 NOTE — Discharge Summary (Signed)
 Physician Discharge Summary Note  Patient:  Melissa [name] is an 23 y.o., female MRN:  [id number] DOB:  [birth year] Patient phone:  [phone] (home)  Patient address:   [address] [place] [address],  Total Time spent with patient: 30 minutes  Date of Admission:  10/10/2024 Date of Discharge: 10/14/2024  Reason for Admission:  Melissa [name] is a 23 year old female with no significant psychiatric history but with recent medical history significant for intentional pregnancy at [redacted]w[redacted]d who is admitted to the behavioral health unit for worsening depression symptoms and suicidal ideation.   Principal Problem: MDD (major depressive disorder) Discharge Diagnoses: Principal Problem:   MDD (major depressive disorder) Active Problems:   MDD (major depressive disorder), recurrent severe, without psychosis (HCC)   Past Psychiatric History:  Previous Psych Diagnoses: None Prior inpatient treatment: None Current/prior outpatient treatment: saw a clinical biochemist when younger, no formal dx Prior rehab hx: denies Psychotherapy hx:no History of suicide: no History of homicide or aggression: None Psychiatric medication history: None Psychiatric medication compliance history: N/A Neuromodulation history: None Current Psychiatrist: None Current therapist: None   Substance Abuse Hx: Alcohol: No  Tobacco: no history Illicit drugs:  Marijuana/THC: used to smoke, quit 4 years ago prior to pregnancy  Other: no hx of other drugs   Past Medical History: Medical Diagnoses: 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 Adopted by Aunt/uncle at age 76. Graduated high school in [place]. No school afterwards. Grades were good, no IEP. No behavioral issues. Abuse: Patient denies, but avoids eye contact on this question Marital Status: single, never married. Recent relationship breakup Sexual orientation: prefers men Children: 6 year old son [name], currently with his father Employment: Print production planner at [employer] Peer Group: Denies close friends Housing: Has her own apartment Finances: strained Legal: no legal issues or pending court cases Military: no affiliation  Social History:  Social History   Substance and Sexual Activity  Alcohol Use No     Social History   Substance and Sexual Activity  Drug Use No    Social History   Socioeconomic History   Marital status: Single    Spouse name: Not on file   Number of children: 1   Years of education: Not on file   Highest education level: Not on file  Occupational History   Occupation: Dietary Aid  Tobacco Use   Smoking status: Never   Smokeless tobacco: Never  Vaping Use   Vaping status: Never Used  Substance and Sexual Activity   Alcohol use: No   Drug use: No   Sexual activity: Yes    Birth control/protection: None  Other Topics Concern   Not on file  Social History Narrative   Not on file   Social Drivers of Health   Financial Resource Strain: Not on File (09/17/2022)   Received from [facility]    Financial Resource Strain: 0  Food Insecurity: No Food Insecurity (10/10/2024)  Hunger Vital Sign    Worried About Running Out of Food in the Last Year: Never true    Ran Out of Food in the Last Year: Never true  Transportation Needs: No Transportation Needs (10/10/2024)   PRAPARE -  Administrator, Civil Service (Medical): No    Lack of Transportation (Non-Medical): No  Physical Activity: Not on File (09/17/2022)   Received from [facility]   Physical Activity    Physical Activity: 0  Stress: Not on File (03/25/2022)   Received from [facility]   Stress    Stress: 0  Social Connections: Not on File (08/15/2023)   Received from [facility]   Social Connections    Connectedness: 0    Hospital Course:  During the patient's hospitalization, patient had extensive initial psychiatric evaluation, and follow-up psychiatric evaluations every day.  Psychiatric diagnoses provided upon initial assessment:  Major depressive disorder Vitamin D deficiency  Patient's psychiatric medications were adjusted on admission: None, patient is pregnant and reluctant to start medication at this time  During the hospitalization, other adjustments were made to the patient's psychiatric medication regimen: Started weekly vitamin D, prenatal vitamin  Patient's care was discussed during the interdisciplinary team meeting every day during the hospitalization.  The patient denied having side effects to prescribed psychiatric medication.  Gradually, patient started adjusting to milieu. The patient was evaluated each day by a clinical provider to ascertain response to treatment. Improvement was noted by the patient's report of decreasing symptoms, improved sleep and appetite, affect, medication tolerance, behavior, and participation in unit programming.  Patient was asked each day to complete a self inventory noting mood, mental status, pain, new symptoms, anxiety and concerns.   Symptoms were reported as significantly decreased or resolved completely by discharge.  The patient reports that their mood is stable.  The patient denied having suicidal thoughts for more than 48 hours prior to discharge.  Patient denies having homicidal thoughts.  Patient denies having auditory hallucinations.  Patient denies  any visual hallucinations or other symptoms of psychosis.  The patient was motivated to continue taking medication with a goal of continued improvement in mental health.   The patient reports their target psychiatric symptoms of depression, suicidality responded well to the psychiatric medications, and the patient reports overall benefit other psychiatric hospitalization. Supportive psychotherapy was provided to the patient. The patient also participated in regular group therapy while hospitalized. Coping skills, problem solving as well as relaxation therapies were also part of the unit programming.  Labs were reviewed with the patient, and abnormal results were discussed with the patient.  The patient is able to verbalize their individual safety plan to this provider.  # It is recommended to the patient to continue psychiatric medications as prescribed, after discharge from the hospital.    # It is recommended to the patient to follow up with your outpatient psychiatric provider and PCP.  # It was discussed with the patient, the impact of alcohol, drugs, tobacco have been there overall psychiatric and medical wellbeing, and total abstinence from substance use was recommended the patient.ed.  # Prescriptions provided or sent directly to preferred pharmacy at discharge. Patient agreeable to plan. Given opportunity to ask questions. Appears to feel comfortable with discharge.    # In the event of worsening symptoms, the patient is instructed to call the crisis hotline, 911 and or go to the nearest ED for appropriate evaluation and treatment of symptoms. To follow-up with primary care provider for other medical issues,  concerns and or health care needs  # Patient was discharged home with a plan to follow up as noted below.    On day of discharge She reports that she is feeling better since her admission.  Discussed with her the importance of continuing her  vitamins given her deficiency and being  pregnant and she reported understanding.  She reports no side effects to her medication/vitamins.  She reports her sleep is good.  She reports her appetite is improving.  She reports no SI, HI, or AVH.  Discussed with her the importance of taking her medications as prescribed and attending her follow up appointments and she reported understanding.  Discussed with her what to do in the event of a future crisis.  Discussed that she can go to [facility], go to the nearest ED, or call 911 or 988.   She reported understanding and had no concerns    Physical Findings: AIMS:  , ,  ,  ,  ,  ,   CIWA:    COWS:     Musculoskeletal: Strength & Muscle Tone: within normal limits Gait & Station: normal Patient leans: N/A   Mental Status Exam:   Appearance: Patient is a healthy-appearing African American female with well-cared for hair and nails.  Behavior: Patient   Attitude: Cooperative, pleasant  Speech: Clear, coherent, normal volume  Mood: I'm doing ok this morning.  Affect: Euthymic, appropriate  Thought Process: Coherent, linear  Thought Content: Denies paranoia, WNL  SI/HI: Denies SI/HI  Perceptions: denies, not seen responding   Judgement: Good  Insight: Good  Fund of Knowledge: WNL     Sleep  Sleep:No data recorded Estimated Sleeping Duration (Last 24 Hours): 5.75-7.50 hours (Due to Daylight Saving Time, the durations displayed may not accurately represent documentation during the time change interval)   Physical Exam: Vitals and nursing note reviewed.  Constitutional:      General: She is not in acute distress.    Appearance: Normal appearance. She is not ill-appearing.  HENT:     Head: Normocephalic and atraumatic.     Nose: Nose normal.  Pulmonary:     Effort: Pulmonary effort is normal.  Skin:    General: Skin is warm and dry.     Capillary Refill: Capillary refill takes less than 2 seconds.  Neurological:     Mental Status: She is alert and oriented to person, place, and  time.  Psychiatric:        Behavior: Behavior normal.        Thought Content: Thought content normal.        Judgment: Judgment normal.     Review of Systems  Constitutional:  Negative for chills, diaphoresis, fever, malaise/fatigue and weight loss.  Respiratory:  Negative for cough.   Cardiovascular:  Negative for chest pain.  Gastrointestinal:  Negative for abdominal pain, constipation, diarrhea, nausea and vomiting.  Genitourinary:  Negative for urgency.  Neurological:  Positive for headaches.  Psychiatric/Behavioral:  Negative for depression, hallucinations, substance abuse and suicidal ideas. The patient is not nervous/anxious and does not have insomnia.   All other systems reviewed and are negative.  Blood pressure 99/63, pulse 72, temperature 98.2 F (36.8 C), temperature source Oral, resp. rate 12, height 5' 1 (1.549 m), weight 45.4 kg, last menstrual period 07/21/2024, SpO2 100%, currently breastfeeding. Body mass index is 18.89 kg/m.     Social History   Tobacco Use  Smoking Status Never  Smokeless Tobacco Never   Tobacco Cessation:  N/A,  patient does not currently use tobacco products   Blood Alcohol level:  Lab Results  Component Value Date   [facility] <15 10/09/2024    Metabolic Disorder Labs:  Lab Results  Component Value Date   HGBA1C 5.0 10/04/2024   No results found for: PROLACTIN No results found for: CHOL, TRIG, HDL, CHOLHDL, VLDL, LDLCALC  See Psychiatric Specialty Exam and Suicide Risk Assessment completed by Attending Physician prior to discharge.  Discharge destination:  Home  Is patient on multiple antipsychotic therapies at discharge:  No   Has Patient had three or more failed trials of antipsychotic monotherapy by history:  No  Recommended Plan for Multiple Antipsychotic Therapies: NA   Allergies as of 10/14/2024   No Known Allergies      Medication List     STOP taking these medications    acetaminophen  325 MG  tablet Commonly known as: Tylenol    coconut oil Oil   ibuprofen  600 MG tablet Commonly known as: ADVIL    Norethindrone  Acetate-Ethinyl Estrad-FE 1-20 MG-MCG(24) tablet Commonly known as: LOESTRIN 24 FE   promethazine 25 MG tablet Commonly known as: PHENERGAN   simethicone  80 MG chewable tablet Commonly known as: MYLICON       TAKE these medications      Indication  ondansetron  4 MG disintegrating tablet Commonly known as: ZOFRAN -ODT Take 1 tablet (4 mg total) by mouth every 6 (six) hours as needed for nausea.  Indication: Nausea and Vomiting in Pregnancy   prenatal multivitamin Tabs tablet Take 1 tablet by mouth daily at 12 noon.  Indication: Pregnancy, Vitamin Deficiency   Vitamin D (Ergocalciferol) 1.25 MG (50000 UNIT) Caps capsule Commonly known as: DRISDOL Take 1 capsule (50,000 Units total) by mouth every 7 (seven) days. Start taking on: October 18, 2024  Indication: Vitamin D Deficiency        Follow-up Information     [facility] Go on 10/17/2024.   Why: You have a hospital follow up appointment for therapy and medication management services on 10/17/24 at 12:30 pm, in person.  * Please bring your photo ID, and insurance card with you. Contact information: [address] [place] [address] [phone]         [facility]. Call.   Why: You may contact this agency to get involved with pregnancy support groups and counseling. Contact information: [phone]  [phone] [place] [address]        [facility]). Call.   Why: You may contact this agency to get involved with pregnancy support groups. Contact information: [address] [place], [place]  [address]  Phone: [phone]                Follow-up recommendations: Plan Of Care/Follow-up recommendations:  Activity: as tolerated   Diet: heart healthy   Other: -Follow-up with your outpatient psychiatric  provider -instructions on appointment date, time, and address (location) are provided to you in discharge paperwork.   -Take your psychiatric medications as prescribed at discharge - instructions are provided to you in the discharge paperwork   -Follow-up with outpatient primary care doctor and other specialists -for management of chronic medical disease, including: pregnancy with kells isoimmunization with need for monitoring, vitamin d deficiency   -Testing: Follow-up with outpatient provider for abnormal lab results: vitamin d deficiency   -Recommend abstinence from alcohol, tobacco, and other illicit drug use at discharge.    -If your psychiatric symptoms recur, worsen, or if  you have side effects to your psychiatric medications, call your outpatient psychiatric provider, 911, [phone] or go to the nearest emergency department.   -If suicidal thoughts recur, call your outpatient psychiatric provider, 911, 988 or go to the nearest emergency department.    Signed: Lynwood Morene Lavone Delsie, MD 10/14/2024, 3:16 PM

## 2024-10-14 NOTE — [facility] Suicide Risk Assessment (Signed)
 Suicide Risk Assessment  Discharge Assessment    [facility] Discharge Suicide Risk Assessment  Principal Problem: MDD (major depressive disorder) Discharge Diagnoses: Principal Problem:   MDD (major depressive disorder)  Total Time spent with patient: 30 minutes  Musculoskeletal: Strength & Muscle Tone: within normal limits Gait & Station: normal Patient leans: N/A  Mental Status Exam:  Appearance: Patient is a healthy-appearing African American female with well-cared for hair and nails.  Behavior: Patient   Attitude: Cooperative, pleasant  Speech: Clear, coherent, normal volume  Mood: I'm doing ok this morning.  Affect: Euthymic, appropriate  Thought Process: Coherent, linear  Thought Content: Denies paranoia, WNL  SI/HI: Denies SI/HI  Perceptions: denies, not seen responding   Judgement: Good  Insight: Good  Fund of Knowledge: WNL    Sleep  Sleep:No data recorded Estimated Sleeping Duration (Last 24 Hours): 5.75-7.50 hours (Due to Daylight Saving Time, the durations displayed may not accurately represent documentation during the time change interval)  Physical Exam Vitals and nursing note reviewed.  Constitutional:      General: She is not in acute distress.    Appearance: Normal appearance. She is not ill-appearing.  HENT:     Head: Normocephalic and atraumatic.     Nose: Nose normal.  Pulmonary:     Effort: Pulmonary effort is normal.  Skin:    General: Skin is warm and dry.     Capillary Refill: Capillary refill takes less than 2 seconds.  Neurological:     Mental Status: She is alert and oriented to person, place, and time.  Psychiatric:        Behavior: Behavior normal.        Thought Content: Thought content normal.        Judgment: Judgment normal.    Review of Systems  Constitutional:  Negative for chills, diaphoresis, fever, malaise/fatigue and weight loss.  Respiratory:  Negative for cough.   Cardiovascular:  Negative for chest pain.   Gastrointestinal:  Negative for abdominal pain, constipation, diarrhea, nausea and vomiting.  Genitourinary:  Negative for urgency.  Neurological:  Positive for headaches.  Psychiatric/Behavioral:  Negative for depression, hallucinations, substance abuse and suicidal ideas. The patient is not nervous/anxious and does not have insomnia.   All other systems reviewed and are negative.  Blood pressure 99/63, pulse 72, temperature 98.2 F (36.8 C), temperature source Oral, resp. rate 12, height 5' 1 (1.549 m), weight 45.4 kg, last menstrual period 07/21/2024, SpO2 100%, currently breastfeeding. Body mass index is 18.89 kg/m.  Mental Status Per Nursing Assessment::   On Admission:  Self-harm thoughts, Self-harm behaviors  Suicide Risk Assessment:  Suicidal ideation/thoughts:   []  Current  [x]  Recent  [x]  Denies Current  []  Remote  Intention to act or plan:        []  Current  []  Recent [x] Denies Current  []  Remote  Preparatory behavior:  []  Recent  [x]  Denies []  Remote  Suicide attempts:                []  This admission []  Recent  [x]  Denies   []  Multiple   []  Remote      Risk Factors  Protective Factors  Acute  Withdrawing from society and Feelings of humiliation, shame, or guilt AcuteSuicideProtectiveFactors: No access to highly lethal means, Denies current SI or Intent, No recent suicide attempts, No recent self-harm behavior, No pattern of escalating substance use, Not in acute distress, No signs of apparent anger/rage, Future oriented, Sense of purpose, Patient is a caretaker  for others, No command AH encouraging suicide, No recent exposure to suicide, No worsening medical conditions, No recent psychiatric hospitalizations, and Executive function intact  Chronic Major psychiatric disorder, Pattern of impulsivity/recklessness, and Age (15-24 or >60) No previous suicide attempt, No previous self-harm behaviors, No major psychiatric disorder, No history of cluster B personality  disorder/traits, Good coping or problem solving skills, No pattern of impulsivity/recklessness, No history of violence, No family hx of suicide attempts, Employed, Stable housing, Good physical health, No barriers to healthcare access, Willingness to seek help, Engagement in therapy, and Achievable life goals/ambitions   Potential future factors: FutureSuicideFactors : Expected change in living situation  Summary: While it is impossible to accurately predict with absolute certainty future events and human behaviors, an assessment of current suicidal indicators, risk factors, and protective factors suggests that this patient's:   Acute suicide risk pd:fpwpfjo in degree.   Chronic suicide risk pd:fpwpfjo in degree. Increases with substance/alcohol use and acute intoxication.   Follow-up Information     [employer] Go on 10/17/2024.   Why: You have a hospital follow up appointment for therapy and medication management services on 10/17/24 at 12:30 pm, in person.  * Please bring your photo ID, and insurance card with you. Contact information: [address] [place] [address] [phone]         [facility]. Call.   Why: You may contact this agency to get involved with pregnancy support groups and counseling. Contact information: [phone]  [address] [place] [address]        [facility] [place]). Call.   Why: You may contact this agency to get involved with pregnancy support groups. Contact information: [address] [place], [place]  [address]  Phone: [phone]                Plan Of Care/Follow-up recommendations:  Activity: as tolerated  Diet: heart healthy  Other: -Follow-up with your outpatient psychiatric provider -instructions on appointment date, time, and address (location) are provided to you in discharge paperwork.  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   -If your psychiatric symptoms recur, worsen, or if you have side effects to your psychiatric medications, call your outpatient psychiatric provider, 911, [phone] or go to the nearest emergency department.  -If suicidal thoughts recur, call your outpatient psychiatric provider, 911, [phone] or go to the nearest emergency department.  Lynwood Morene Lavone Delsie, MD 10/14/2024, 7:50 AM

## 2024-10-14 NOTE — Discharge Instructions (Addendum)
 Managing Depression in Pregnancy If you are pregnant and feeling sad, hopeless, or anxious, you are not alone. Depression during pregnancy is common, and there are safe, effective ways to help you feel better without using medication.  Evidence-based non-medication strategies: - Talk therapy: Meeting with a therapist for cognitive behavioral therapy (CBT) or interpersonal therapy (IPT) can help you manage negative thoughts and improve your mood. These therapies are proven to help pregnant women with depression.[1][2] - Mindfulness and relaxation: Mindfulness therapy, such as meditation or guided breathing, can reduce stress and depressive symptoms. Mindfulness is one of the most effective non-medication treatments for depression in pregnancy.[3] - Exercise: Regular, gentle exercise like walking, swimming, or prenatal yoga can help lift your mood. Aim for low to moderate intensity, as you feel able.[1][3][4] - Body-oriented therapies: Yoga and muscle relaxation exercises may also help reduce symptoms of depression.[3][4][5] - Support groups and education: Joining a support group or learning more about depression can help you feel less alone and more empowered.[2][3]  Diet and supplements: - Prenatal vitamins: Take a daily prenatal vitamin that includes at least 400 micrograms of folic acid. This helps prevent birth defects and supports your health during pregnancy. Start before pregnancy if possible and continue throughout pregnancy and breastfeeding.[4][6][7] - Vitamin D: Vitamin D is important for your health and your baby's development. Most prenatal vitamins contain vitamin D, but some people may need extra. Weekly high-dose vitamin D supplementation (such as 50,000 IU once a week) may be recommended if you have low vitamin D levels or are at risk for deficiency, unless your doctor says it is not safe for you. Vitamin D may help lower the risk of pregnancy complications and may have a small benefit  for mood, but it is not a substitute for therapy or other treatments.[1][7][8][9][10][11][12][13][14] - Other supplements: There is not enough evidence to recommend omega-3 fatty acids, iron, zinc, or magnesium for depression unless you have a deficiency.[1][7][12]  Discharge instructions: - Take your prenatal vitamin every day as directed. - If prescribed, take high-dose vitamin D (such as 50,000 IU once a week) as instructed by your healthcare provider. Do not take extra vitamin D unless recommended. - Continue with therapy, mindfulness, exercise, or other activities that help your mood. - Reach out to your healthcare provider if you feel worse, have thoughts of harming yourself, or need more support.  Remember: Depression is treatable, and help is available. Talk to your healthcare team about which options are best for you and your baby.  References Peripartum Depression: Detection and Treatment. Lamarr Ard MD, Darien Jourdaine DO. American Academy of Family Physicians [phone]). Effectiveness of Psychological Therapies for Depression During the Perinatal Period : A Systematic Review and Meta-Analysis. Fernanda FORBES Reatha WILKIE, Kanaan G, et al. Annals of Internal Medicine. 2025;. doi:10.7326/ANNALS-24-03520. Effects of Non-Pharmacological Interventions on Depressive and Anxiety Symptoms in Pregnant Women: A Systematic Review and Network Meta-Analysis. Zeng G, Niu J, Zhu K, et al. EClinicalMedicine. 2025;79:103011. doi:10.1016/j.eclinm.7975.896988. Management of Pregnancy (2023). Elida KYM Sours MSN RN, Ozell Grow DO FAAFP, Bernarda Lye MD MHSCR, et al. Department of [facility]. Interventions to Treat Mental Disorders During Pregnancy: A Systematic Review and Multiple Treatment Meta-Analysis. van Ravesteyn LM, Lambregtse-van den Berg MP, Hoogendijk WJ, Kamperman AM. PloS One. 2017;12(3):e0173397. doi:10.1371/journal.pone.9826602. Prepregnancy Counseling. Toribio HERO. [place], [place].  [employer] of Obstetricians and Gynecologists (2019). Dietary Supplements in the Perinatal Period. Mokashi M, Cozzi-Glaser G, Kominiarek MA. Obstetrics and Gynecology. 2025;:[id number]. doi:10.1097/AOG.[id number]. Vitamin D for the Prevention of Disease: An  Endocrine Society Clinical Practice Guideline. Demay MB, Pittas AG, Bikle DD, et al. The Journal of Clinical Endocrinology and Metabolism. 2024;109(8):1907-1947. doi:10.1210/clinem/dgae290. Vitamin D Supplementation for Women During Pregnancy. Palacios C, Kostiuk LL, Cuthbert A, Weeks J. The Cochrane Database of Systematic Reviews. 7975;2:RI991126. doi:10.1002/14651858.RI991126.pub5. Regimens of Vitamin D Supplementation for Women During Pregnancy. Gaspar BROCKS, Trak-Fellermeier MA, Chandra FATE, et al. The Cochrane Database of Systematic Reviews. 2019;10:CD013446. doi:10.1002/14651858.RI986553. Management of Major Depressive Disorder (MDD) (2022). Jerri Marlin DNP FNP-BC, Prentice Kanner DO, Jerrell Splinter MD MSc FAPA FACP, et al. Department of [facility]. Dietary Interventions for Perinatal Depression and Anxiety: A Systematic Review and Meta-Analysis of Randomized Controlled Trials. Micheal JENEANE Maree LOISE [name] RAYMOND, et al. The American Journal of Clinical Nutrition. 2023;117(6):1130-1142. doi:10.1016/j.ajcnut.2023.03.025. A Systematic Review of Vitamin D During Pregnancy and Postnatally and Symptoms of Depression in the Antenatal and Postpartum Period From Randomized Controlled Trials and Observational Studies. [address], [address] RA, Green TJ, Makrides M. Nutrients. 2022;14(11):2300. doi:10.3390/nu14112300. Vitamin D Levels and Depressive Symptoms During Pregnancy: A Prospective Pregnancy Cohort Study. Seppl V, Tuovinen S, Lahti-Pulkkinen M, et al. Depression and Anxiety. 2024;2024:1788167. doi:10.1155/2024/1788167. ___ Learning about therapy: The term therapy can mean many things. There are many different types of  psychotherapy, some are short-term, others take a longer period of time. A good therapist will meet with you, discuss your goals, and help you set a treatment plan that addresses your major concerns, regardless of what type of therapy they practice. If the first therapist is not a great fit, try another one.   The American Psychological Association (the APA) has resources to learn about different kinds of therapy.  Here is a link to learn more about the different kinds of psychotherapy:   gymville.si ____ Outpatient Therapy and Psychiatry Resources for Patients: Your psychiatric needs would be well-served by consultation and regular meetings with an outpatient therapist to assist you with your mood-related conditions. Here are a series of links for finding a therapist.    Includes links to the following: [facility] (http://wilson-mayo.com/) (only for [facility] and please reserve for uninsured) [facility] [place] (http://blankenship-martinez.net/) Psychology Today Special Educational Needs Teacher (https://www.psychologytoday.com/us lendell) Psychology Today Support Group Tax Inspector (https://www.psychologytoday.com/us /groups/) [employer] - [employer] Location (https://carolinabehavioralcare.com/staff-location/[place]/) Mental [facility] Alliance [facility] - Support Group Finder - (recorddebt.fi) Family Services of the [employer] - Lexicographer (https://fspcares.org/contact/) [employer] for Mental Health [place] - NAMI (https://namiguilford.org/support-and-education/support-groups/) [employer] [facility] - Affiliated with [facility]  (https://www.Vilas.com/lb/locations/profile/[facility]-[place]-behavioral-medicine-at-[address]/) Dept of Health and Human Services - Find a mental health facility (http://lester.info/) ____ Recommended Reading List for Teens and Adolescents Struggling with Depression, Anger, and Anxiety This reading list is designed to help teens explore emotional regulation, resilience, and meaning-making. Each book has been selected for accessibility, clinical usefulness, and potential for guided discussion with a parent, mentor, or therapist.  ?? Note: Reading can complement but not replace professional mental health treatment. These resources are best used alongside support from a trusted adult or therapist.  ?? Emotional Regulation & Depression **Stuff That Sucks: A Teen's Guide to Accepting What You Can't Change and Committing to What You Can** - Odis Cote, PhD Acceptance and Commitment Therapy (ACT)-based workbook to help teens manage tough emotions. **Be Mindful and Stress Less: 50 Ways to Deal with Your (Crazy) Life** - Tillman Puff, LMFT Short, practical mindfulness strategies for managing daily stress and mood. **You Are Not Alone: The NAMI Guide to Navigating Mental Health** - India Ferns, MD Real stories and expert advice  offering hope and connection. **The Self-Esteem Workbook for Teens** - Olam M. Schab, LCSW CBT-based exercises to strengthen confidence and emotional awareness.  ?? Anger & Emotional Outbursts **The Anger Workbook for Teens: Activities to Help You Deal with Anger and Frustration** - Raychelle Skeet Beals, PhD, [facility] Evidence-based CBT workbook for identifying triggers and practicing healthy anger management. **What to Do When Your Temper Flares: A Kid's Guide to Overcoming Problems with Anger** - Stephane Maywood, PhD Accessible illustrated guide to anger control, great for visual learners.  ?? Anxiety & Worry **Mind Over Mood (2nd Edition)** - Marinda Means & Wanda Formosa Classic CBT workbook for identifying and reshaping negative thought patterns. **Don't Let Your Emotions Run Your Life for Teens** - Tylene Fleeta Fox, MSW DBT-based strategies for managing strong emotions and improving mindfulness. **The Anxiety Survival Guide for Teens** - Delon Kirsch, LMFT Engaging, comic-style CBT workbook for anxious teens. The Anxiety and Depression Workbook for Teens - Ozell Alar, PhD  Particularly recommended by one of the therapists here at [facility] who teaches other therapists and psychiatrists  ?? Hope, Identity & Meaning-Making **The Gifts of Imperfection (Teen Edition)** - Bernadette Daring, PhD Encourages authenticity, self-compassion, and courage. **It's Kind of a Funny Story** - Ned Vizzini A hopeful and honest novel about teen depression and recovery. **Turtles All the Way Down** - Norleen Seip Portrays anxiety and intrusive thoughts with empathy and realism. **Dr. Namon Advice for Sad Poets** - Evan Roskos Blends humor and introspection for teens dealing with family conflict and depression.  ??? Stretch Book - For Deep Reflection **Man's Search for Meaning** - Viktor E. Frankl A profound exploration of purpose and resilience through suffering. Best for guided or reflective reading.  ?? Optional Discussion Companions **How to Be Yourself: Quiet Your [employer] Rx and Rise Above Social Anxiety** - Leeroy Berkshire, PhD Accessible strategies for self-acceptance and confidence. **The Happiness Trap (Pocketbook Edition)** - Eligha Lesches, MD ACT-based guide to living meaningfully even when uncomfortable emotions arise. **When Things Fall Apart** - Pema Chdrn Gentle Buddhist reflections for emotional resilience and acceptance. __ African American-Authored Books for Teens Dealing with Anger, Depression, and Anxiety This handout highlights powerful works by [employer] authors that explore emotional pain,  resilience, identity, and hope. These books may help teens process feelings of anger, sadness, and anxiety while connecting to authentic voices and stories.  ?? Note: These books are not a substitute for therapy or medical care. They may support reflection, empathy, and resilience when used alongside trusted support.  ?? Core YA Fiction & Verse **Me (Moth)** - Geographical Information Systems Officer A lyrical novel-in-verse about grief, identity, and healing after loss -- ideal for introspective teens. **Forever Is Now** - Mariama J. Lockington A poetic story about a teen managing chronic anxiety and fear, learning mindfulness and self-acceptance. **Grown** - Tiffany D. Jackson A gripping, emotionally intense story exploring trauma, exploitation, and reclaiming one's voice. **The Black Kids** - Christina Hammonds Reed Coming-of-age story set during racial unrest in [place]; explores identity, belonging, and anger. **Blackout** - Dhonielle Ivonne, Annabella CHARM Mace, Nic Bethena, Angie [place], Rosina Dresser, Guerry Needle Interwoven love stories of Black teens navigating crisis and emotional transformation. **Black Enough: Stories of Being Young & Black in America** - Edited by Lenoard Song An anthology of essays and short fiction about identity, self-worth, and resilience.  ?? Real-Life Stories & Memoirs **Willow Weep for Me: A Black Woman's Journey Through Depression** - Nana-Ama Danquah A deeply personal memoir exploring depression, stigma, and strength in  the context of Black womanhood. **The Unapologetic Guide to [facility] Mental Health** - Judyann Finder, PhD Accessible guide to understanding and protecting Black mental health -- empowering for teens and families alike.  ?? Additional Recommended Titles **Anger Is a Gift** - [employer] Explores grief, anxiety, and systemic injustice through the eyes of a young Black teen. Transforms anger into activism. **Home Home** - Lisa Allen-Agostini A Caribbean teen faces  depression and identity challenges after being uprooted to [place]. **Who Put This Song On?** - Joesph Kitty An 'emo' Black teen navigates depression and identity in a predominantly white environment. **When the Stars Lead to You** - Ronni Nicholaus LABOR story about first love, mental health struggles, and rediscovering purpose. **The Voice in My Head** - Dana L. Davis Explores grief, spirituality, and identity through the lens of a teen coping with loss.  ?? Using These Books in Healing and Growth  **Representation matters:** Seeing oneself reflected in characters helps reduce isolation and normalizes complex emotions.  **Emotional vocabulary:** Stories give teens language to describe anger, grief, and sadness.  **Safe exploration:** Fiction and memoir allow emotional processing through metaphor and empathy.  **Discussion starters:** Ideal for use in therapy, group work, or mentoring conversations.  **Content awareness:** Some titles include trauma or violence; preview content when needed. __ Closing notes from your doctor: It was a pleasure taking care of you while you were with us . I want to stress once more that medications are part of, but not ALL of what we must do to take care of ourselves mentally.   We all need: - Physical activity - at least 3, but preferably 5 days per week where we exercise for 30 minutes at a level where we get out of breath. That will vary by person and health conditions, but it will improve your mental health as well as your physical health. - Good restful sleep - avoid using a phone or screens in bed, sleep in a dark room, use a white noise machine or app and if you are having trouble sleeping, consult your doctor. - To reduce use of substances - alcohol and tobacco are shown to be harmful to many parts of our health, if you need help, ask for it. The same is true for illegal drugs, if you need help, ask for it. - To find purpose - whether it is caring for loved ones,  volunteering, pursuing hobbies, or doing work that means something to you, try and live in accordance with your values. - Healthy relationships - seek healthy relationships where the other person helps you grow and challenges you to be your best. If it does not feel good, seek assistance.  JINNY Morene GORMAN Delsie, MD [facility] Psychiatry Resident

## 2024-10-14 NOTE — Progress Notes (Signed)
  [facility] Adult Case Management Discharge Plan :  Will you be returning to the same living situation after discharge:  Yes,  [address] At discharge, do you have transportation home?: Yes,  The patient partner will provide transport. Do you have the ability to pay for your medications: Yes,  Insurance  Release of information consent forms completed and in the chart;  Patient's signature needed at discharge.  Patient to Follow up at:  Follow-up Information     [facility] Go on 10/17/2024.   Why: You have a hospital follow up appointment for therapy and medication management services on 10/17/24 at 12:30 pm, in person.  * Please bring your photo ID, and insurance card with you. Contact information: [address] [place] [address] [phone]         [facility] Call.   Why: You may contact this agency to get involved with pregnancy support groups and counseling. Contact information: [phone]  [address] [place] [address]        [facility]). Call.   Why: You may contact this agency to get involved with pregnancy support groups. Contact information: [address] [place], [place]  [address]  Phone: [phone]                Next level of care provider has access to [facility] Link:yes  Safety Planning and Suicide Prevention discussed: Yes,  Completed with Lucy Cutter (child's father) [phone] on 10/12/24     Has patient been referred to the Quitline?: Patient does not use tobacco/nicotine products  Patient has been referred for addiction treatment: Yes, the patient will follow up with an outpatient provider for substance use disorder. Psychiatrist/APP: appointment made and Therapist: appointment made  Roselyn GORMAN Lento, LCSW 10/14/2024, 9:26 AM

## 2024-10-17 ENCOUNTER — Telehealth: Payer: Self-pay | Admitting: Licensed Clinical Social Worker

## 2024-10-17 ENCOUNTER — Telehealth: Payer: Self-pay

## 2024-10-17 NOTE — Telephone Encounter (Signed)
 [facility] contacted patient on this date to schedule [facility] appointment. A VM was left.

## 2024-10-17 NOTE — Telephone Encounter (Signed)
 Patient called requesting a pregnancy restrictions letter for work. Patient's letter was sent via Mychart.  Ria Redcay l Lamonda Noxon, CMA

## 2024-10-18 ENCOUNTER — Telehealth: Payer: Self-pay | Admitting: Licensed Clinical Social Worker

## 2024-10-18 NOTE — Telephone Encounter (Signed)
 [facility] contacted patient on this date and left a VM.

## 2024-10-25 ENCOUNTER — Other Ambulatory Visit: Payer: Self-pay | Admitting: *Deleted

## 2024-10-25 ENCOUNTER — Ambulatory Visit (HOSPITAL_BASED_OUTPATIENT_CLINIC_OR_DEPARTMENT_OTHER): Admitting: Obstetrics and Gynecology

## 2024-10-25 ENCOUNTER — Ambulatory Visit

## 2024-10-25 ENCOUNTER — Ambulatory Visit: Attending: Obstetrics and Gynecology

## 2024-10-25 VITALS — BP 98/62 | HR 91

## 2024-10-25 DIAGNOSIS — Z3A13 13 weeks gestation of pregnancy: Secondary | ICD-10-CM | POA: Diagnosis present

## 2024-10-25 DIAGNOSIS — O361910 Maternal care for other isoimmunization, first trimester, not applicable or unspecified: Secondary | ICD-10-CM | POA: Insufficient documentation

## 2024-10-25 NOTE — Progress Notes (Signed)
 [facility] Melissa at [facility] [address], [address] Phone:  [phone]   Fax:  [phone]      In-Person Genetic Counseling Clinic Note:   I spoke with 23 y.o. 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 She was referred by Melissa Rollo DASEN, MD. She was accompanied by FOB Melissa [name].   Pregnancy History:    G3P1011. EGA: [redacted]w[redacted]d by LMP. EDD: 04/27/2025. Melissa [name] has [name] healthy son. She had a recent IAB for personal reasons. Reports she takes PNVs and vitamin D . Denies major personal health concerns. Denies personal history of diabetes, high blood pressure, thyroid conditions, and seizures. Denies bleeding, infections, and fevers in this pregnancy.Denies using tobacco, alcohol, or street drugs in this pregnancy.   Family History:    A three-generation pedigree was created and scanned into Epic under the Media tab.  Patient ethnicity reported as Black and FOB ethnicity reported as Black. Denies Ashkenazi Jewish ancestry.  Family history not remarkable for consanguinity, individuals with birth defects, intellectual disability, autism spectrum disorder, multiple spontaneous abortions, still births, or unexplained neonatal death.   Maternal Anti-Kell Antibodies:  The patient was found to have anti-Kell antibodies at a 1:4 titer. She reports she had blood transfusions due to excess blood loss after her recent IAB. Her 40 yo son is healthy, and she states he did not have any signs of hemolytic disease of the newborn. We reviewed that the antibodies were likely acquired from the blood transfusion.  We reviewed the genetics of the Kell antigen and the mode of inheritance. We discussed the options to determine fetal antigen status, such as paternal genotyping, UNITY NIPS with fetal antigen risk assessment, and amniocentesis. The technical aspects, benefits, risks, and limitations of each were reviewed. We  discussed that the need and the frequency of prenatal surveillance through MCA Dopplers will depend on the type of test performed and the subsequent results. We reviewed NIPS is only a screen and may return with false positives or false negatives; however, the lab reports a sensitivity and specificity of 100% for the Kell antigen. After reviewing her options, the patient elected to proceed with UNITY NIPS. We discussed that paternal testing can also be considered if they plan on having more children. The couple declined prenatal diagnosis or paternal testing at this time and elected to proceed with UNITY NIPS for detection of the presence of fetal (placental) Kell antigen. We reviewed this will also re-screen for aneuploidies.   Newborn Screening. The [place]  Newborn Screening (NBS) program will screen all newborn babies for cystic fibrosis, spinal muscular atrophy, hemoglobinopathies, and numerous other conditions.  Previous Testing Completed:  Low risk NIPS: Melissa [name] previously completed Panorama noninvasive prenatal screening (NIPS) in this pregnancy. The result is low risk, consistent with a female fetus. This screening significantly reduces but does not eliminate the chance that the current pregnancy has Down syndrome (trisomy 74), trisomy 87, trisomy 72, common sex chromosome conditions, and certain microdeletion syndromes. There are many genetic conditions that cannot be detected by NIPS.   Negative carrier screening: Melissa [name] previously completed Horizon carrier screening. She screened to not be a carrier for cystic fibrosis (CF), spinal muscular atrophy (SMA), alpha thalassemia, and beta hemoglobinopathies. A negative result on carrier screening reduces but does not eliminate the chance of being a carrier.    Plan of Melissa:   UNITY NIPS for Kell antigen was drawn. We will communicate the results to  the patient. Upcoming MFC ultrasound on 12/05/2024.   Informed consent was obtained. All  questions were answered.   60 minutes were spent on the date of the encounter in service to the patient including preparation, face-to-face consultation, discussion of test reports and available next steps, pedigree construction, genetic risk assessment, documentation, and Melissa coordination.    Thank you for sharing in the Melissa of Melissa [name] with us .  Please do not hesitate to contact us  at [phone] if you have any questions.   Melissa Bodily, MS, [facility] Certified Genetic Counselor   Genetic counseling student involved in appointment: No.

## 2024-10-25 NOTE — Progress Notes (Signed)
 Maternal-Fetal Medicine Consultation  Name: Melissa [name]  MRN: [id number]  GA: [id number] [redacted]w[redacted]d   Patient is here for first trimester ultrasound. She has anti-Kell antibodies detected at routine screen and the titer was 1: 4.  Obstetrical history -07/2021: Term vaginal delivery of a female infant weighing 6 pounds and 4 ounces.  Her pregnancy and delivery were uncomplicated. - 03/2024: Early termination of pregnancy.  Patient had vaginal bleeding for about a month.  A month later, she had blood transfusions for correction of anemia.  Hemoglobin was 5.4%.  Recent hemoglobin was 12.2%. She reports no chronic medical conditions including diabetes or hypertension. Ultrasound The CRL measurement is consistent with the previously established dates.  Fetal anatomical survey that could be ascertained at this gestational age appears normal.  The nuchal translucency measures 1.4 millimeters, which is normal.  Anti-Kell antibodies:  Kell blood group has many antigens and elicit strong immune reaction in a Kell-negative individual. Antigens are acquired through pregnancy with a Kell positive infant or from blood transfusions. Kell-sensitized pregnancies can cause severe hemolysis in the Kell positive fetus and newborn. The antibodies suppress fetal RBC production in the fetal liver and macrophages.  I explained that she most likely acquired anti-Kell antibodies from blood transfusion that was given after D&C.  About 90 to 95% of the population or Kell negative and the likelihood that her partner is also Kell negative is highly likely.  I explained the transplacental transmission and pathway to hemolysis by diagrams. .  If the fetus is Kell antigen positive, it can potentially lead to hemolysis. I counseled the patient on MCA Doppler studies, which is a noninvasive way to detect fetal anemia. I briefly discussed intrauterine blood transfusion.  I informed her that it is important to test the antigen state  of her partner.  Patient is very certain about her partner being the father of her child.  If there is any chance of uncertain paternity, he should not be screened.  Recently, it is possible to determine the Kell antigen status of the fetus by UNITY screening. It has a greater detection rate (not 100%). The couple met with our genetic counselor after ultrasound. Patient opted and had her blood drawn for UNITY screening. We will communicate the results to the patient. If the fetus is Kell negative, we will not recommend intensive fetal surveillance. Infrequent MCA Doppler should be sufficient.  Recommendations -Fetal anatomy scan and MCA Doppler in 6 weeks.     Consultation including face-to-face (more than 50%) counseling 45 minutes.

## 2024-10-26 ENCOUNTER — Encounter: Payer: Self-pay | Admitting: Obstetrics and Gynecology

## 2024-10-29 ENCOUNTER — Ambulatory Visit: Payer: Self-pay

## 2024-10-31 ENCOUNTER — Ambulatory Visit (INDEPENDENT_AMBULATORY_CARE_PROVIDER_SITE_OTHER): Admitting: Obstetrics and Gynecology

## 2024-10-31 VITALS — BP 107/61 | HR 78 | Wt 108.1 lb

## 2024-10-31 DIAGNOSIS — Z3A14 14 weeks gestation of pregnancy: Secondary | ICD-10-CM

## 2024-10-31 DIAGNOSIS — O361990 Maternal care for other isoimmunization, unspecified trimester, not applicable or unspecified: Secondary | ICD-10-CM

## 2024-10-31 DIAGNOSIS — Z3480 Encounter for supervision of other normal pregnancy, unspecified trimester: Secondary | ICD-10-CM

## 2024-10-31 DIAGNOSIS — O99340 Other mental disorders complicating pregnancy, unspecified trimester: Secondary | ICD-10-CM | POA: Diagnosis not present

## 2024-10-31 DIAGNOSIS — F32A Depression, unspecified: Secondary | ICD-10-CM

## 2024-10-31 NOTE — Progress Notes (Signed)
 PRENATAL VISIT NOTE  Subjective:  Melissa [name] is a 23 y.o. G3P1011 at [redacted]w[redacted]d being seen today for ongoing prenatal care.  She is currently monitored for the following issues for this high-risk pregnancy and has Supervision of low-risk pregnancy; GBS (group B Streptococcus carrier), +RV culture, currently pregnant; Vaginal delivery; Supervision of normal intrauterine pregnancy in multigravida; Kell isoimmunization during pregnancy; MDD (major depressive disorder); and MDD (major depressive disorder), recurrent severe, without psychosis (HCC) on their problem list.  Patient reports no complaints.  Contractions: Not present. Vag. Bleeding: None.  Movement: Present. Denies leaking of fluid.   The following portions of the patient's history were reviewed and updated as appropriate: allergies, current medications, past family history, past medical history, past social history, past surgical history and problem list.   Objective:   Vitals:   10/31/24 1518  BP: 107/61  Pulse: 78  Weight: 108 lb 1.3 oz (49 kg)    Fetal Status:  Fetal Heart Rate (bpm): 160   Movement: Present    General: Alert, oriented and cooperative. Patient is in no acute distress.  Skin: Skin is warm and dry. No rash noted.   Cardiovascular: Normal heart rate noted  Respiratory: Normal respiratory effort, no problems with respiration noted  Abdomen: Soft, gravid, appropriate for gestational age.  Pain/Pressure: Absent     Pelvic: Cervical exam deferred        Extremities: Normal range of motion.  Edema: None  Mental Status: Normal mood and affect. Normal behavior. Normal judgment and thought content.      10/09/2024    5:00 PM 10/04/2024    2:12 PM 08/21/2021   11:06 AM  Depression screen PHQ 2/9  Decreased Interest 1 0 0  Down, Depressed, Hopeless 3 0 0  PHQ - 2 Score 4 0 0  Altered sleeping 1 0 0  Tired, decreased energy 1 1 0  Change in appetite 1 1 0  Feeling bad or failure about yourself  2 0 0   Trouble concentrating 2 0 0  Moving slowly or fidgety/restless 1 1 0  Suicidal thoughts 3 0 0  PHQ-9 Score 15  3  0      Data saved with a previous flowsheet row definition        10/04/2024    2:12 PM 08/21/2021   11:07 AM 08/06/2021    2:20 PM 07/15/2021   10:22 AM  GAD 7 : Generalized Anxiety Score  Nervous, Anxious, on Edge 0 0 0 0  Control/stop worrying 1 0 0 0  Worry too much - different things 0 0 1 0  Trouble relaxing 0 0 0 0  Restless 0 0 0 0  Easily annoyed or irritable 0 0 1 0  Afraid - awful might happen 0 0 0 0  Total GAD 7 Score 1 0 2 0    Assessment and Plan:  Pregnancy: G3P1011 at [redacted]w[redacted]d 1. Supervision of other normal pregnancy, antepartum (Primary) Anticipatory guidance Reports neg NIPT and Horizon- results did not cross to Epic, she will send via mychart  2. Kell isoimmunization during pregnancy, antepartum, single or unspecified fetus UNITY Kell NIPS drawn 11/20 Further management pending this result  3. Depression affecting pregnancy Repots mood is good. Denies SI/HI. Reassured of safety of antidepressants during pregnancy but she declines at this time  4. [redacted] weeks gestation of pregnancy    Preterm labor symptoms and general obstetric precautions including but not limited to vaginal bleeding, contractions, leaking of fluid and fetal  movement were reviewed in detail with the patient. Please refer to After Visit Summary for other counseling recommendations.   No follow-ups on file.  Future Appointments  Date Time Provider Department Center  11/26/2024  2:10 PM Eveline Lynwood MATSU, MD [facility] None  12/05/2024  1:00 PM [facility] PROVIDER 1 [facility]  12/05/2024  1:30 PM [facility] US3 [facility]    Rollo ONEIDA Bring, MD

## 2024-11-05 ENCOUNTER — Encounter: Payer: Self-pay | Admitting: Family Medicine

## 2024-11-11 NOTE — ED Provider Notes (Signed)
 [facility]  ED Provider Note  Melissa [name] 23 y.o. female DOB: 11-07-2001 MRN: [id number] History   Chief Complaint  Patient presents with  . Pelvic Pain Pregnant    To ED with continued pelvic pain. Seen 12/5 for same. 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 16 weeks.    Tele-Medical screening initiated and orders placed by Donnice JAYSON Real, NP. 11/11/2024 / 10:23 PM  Patient seen and received a tele-medical screening examination in triage.  The provider performing the medical screening exam was not located at the facility and was located remotely.  Patient understands that the provider is seeing them remotely and consents to the exam.  Appropriate orders have been initiated based on my brief physical exam and HPI. Patient placed in appropriate area until a treatment room becomes available for further evaluation and management by the in-house provider.  This tele-medical screening exam was electronically signed by Donnice JAYSON Real, NP on 11/11/2024 at 10:23 PM  23 y.o. female presents with OB hx G3, P1, A1 now about [redacted] weeks gestation based on LMP of Aug 16th.  Patient reports she has had pelvic pressure that started on Friday.  She was previously seen on 12/5 for similar complaint.  She reports that today it got worse and they checked her vital signs at work and noted she had a elevated heart rate of 119 while in active pain.  She denies any nausea, vomiting, diarrhea.  Denies any dysuria or urinary symptoms.  No vaginal bleeding or discharge She's getting prenatal care through [facility] and had an US  to confirm IUP on 11/09/2024 at [facility]. She was seen previously on 12/5 for similar complaints.    23 year old female with prior medical history as detailed above presents for persistent pelvic pain.  She was seen on Friday for same complaint.  Ultrasound at that time was performed and showed single patient presents with recurrent pelvic discomfort.  She is  pregnant.  Obtained labs and ultrasound imaging does not reveal acute abnormality.  Intrauterine pregnancy.  She complains of continued pain.  She did not take anything for symptoms.  She denies fever, nausea, vomiting, vaginal bleeding, urinary symptoms.   History provided by:  Patient and medical records      Past Medical History[1]  Past Surgical History[2]  Social History   Substance and Sexual Activity  Alcohol Use None   Tobacco Use History[3] E-Cigarettes  . Vaping Use    . Start Date    . Cartridges/Day    . Quit Date     Social History   Substance and Sexual Activity  Drug Use Not on file         Allergies[4]  Home Medications   FAMOTIDINE  (PEPCID ) 10 MG TABLET    Take one tablet (10 mg dose) by mouth 2 (two) times daily for 5 days.   FERROUS SULFATE 325 (65 FE) MG TABLET    Take one tablet (325 mg dose) by mouth daily.   IBUPROFEN  (ADVIL ,MOTRIN ) 800 MG TABLET    Take one tablet (800 mg dose) by mouth every 6 (six) hours as needed for Pain.    Primary Survey   Exposure     No visible trauma noted on back exam.      Review of Systems   Review of Systems  All other systems reviewed and are negative.   Physical Exam   ED Triage Vitals [11/11/24 2152]  BP 104/63  Heart Rate  109  Resp 19  SpO2 100 %  Temp 98.9 F (37.2 C)    Physical Exam  Nursing note and vitals reviewed. Constitutional: She appears well-developed and well-nourished.  HENT:  Head: Normocephalic and atraumatic.  Eyes: EOM are intact. Pupils are equal, round, and reactive to light.  Neck: Normal range of motion. Neck supple.  Cardiovascular: Normal rate and regular rhythm.  Pulmonary/Chest: Respiratory effort normal and breath sounds normal.  Abdominal: Soft. Bowel sounds are normal.  Musculoskeletal: Normal range of motion. No visible trauma noted on back exam.     Cervical back: Normal range of motion and neck supple.   Neurological: She is alert and oriented to  person, place, and time.  Skin: Skin is warm.     ED Course   Lab results: No data to display  Imaging: No data to display   ECG: ECG Results   None                                                                        Pre-Sedation Procedures    Medical Decision Making Patient presents with complaint of continued pelvic discomfort.  She is pregnant.  She denies vaginal bleeding, fever, other symptoms.  Repeat workup including repeat pelvic ultrasound is again without acute abnormality identified.  Patient reassured by these findings.    She requests a note for work.  She feels comfortable with discharge.  Importance close follow-up is stressed.  Strict return precautions given understood.    Amount and/or Complexity of Data Reviewed Labs: ordered. Radiology: ordered.  Risk OTC drugs.          Provider Communication  New Prescriptions   No medications on file    Modified Medications   No medications on file    Discontinued Medications   No medications on file    Clinical Impression Final diagnoses:  Pelvic pain during pregnancy (*)    ED Disposition     ED Disposition  Discharge   Condition  Stable   Comment  --                   Electronically signed by:       [1] History reviewed. No pertinent past medical history. [2] History reviewed. No pertinent surgical history. [3] Social History Tobacco Use  Smoking Status Unknown  Smokeless Tobacco Not on file  [4] No Known Allergies  Maude Johnetta Galloway, MD 11/12/24 [phone]

## 2024-11-12 ENCOUNTER — Encounter: Payer: Self-pay | Admitting: Family Medicine

## 2024-11-12 ENCOUNTER — Telehealth: Payer: Self-pay

## 2024-11-12 NOTE — Telephone Encounter (Addendum)
 Patient returned call. Kell antigen was not detected in the UNITY NIPS sample. There is a very low chance of the fetus developing hemolytic disease of the newborn. Fetal surveillance will depend on the MFM's discretion. Lab report is scanned into patient's medical chart.  Lauraine Bodily, MS, [facility] Certified Genetic Counselor [facility] for Maternal Fetal [facility] [phone]

## 2024-11-12 NOTE — Telephone Encounter (Signed)
 Attempted to call patient with UNITY NIPS that showed fetal Kell antigen was NOT detected. Left message with callback number.

## 2024-11-26 ENCOUNTER — Ambulatory Visit: Admitting: Obstetrics & Gynecology

## 2024-11-26 VITALS — BP 98/57 | HR 74 | Wt 108.0 lb

## 2024-11-26 DIAGNOSIS — Z3A18 18 weeks gestation of pregnancy: Secondary | ICD-10-CM | POA: Diagnosis not present

## 2024-11-26 DIAGNOSIS — Z3482 Encounter for supervision of other normal pregnancy, second trimester: Secondary | ICD-10-CM | POA: Diagnosis not present

## 2024-11-26 DIAGNOSIS — Z3480 Encounter for supervision of other normal pregnancy, unspecified trimester: Secondary | ICD-10-CM

## 2024-11-26 NOTE — Progress Notes (Signed)
 "  PRENATAL VISIT NOTE  Subjective:  Melissa [name] is a 23 y.o. G3P1011 at [redacted]w[redacted]d being seen today for ongoing prenatal care.  She is currently monitored for the following issues for this low-risk pregnancy and has Supervision of low-risk pregnancy; GBS (group B Streptococcus carrier), +RV culture, currently pregnant; Vaginal delivery; Supervision of normal intrauterine pregnancy in multigravida; Kell isoimmunization during pregnancy; MDD (major depressive disorder); and MDD (major depressive disorder), recurrent severe, without psychosis (HCC) on their problem list.  Patient reports round ligament pain.  Contractions: Not present. Vag. Bleeding: None.  Movement: Present. Denies leaking of fluid.   The following portions of the patient's history were reviewed and updated as appropriate: allergies, current medications, past family history, past medical history, past social history, past surgical history and problem list.   Objective:   Vitals:   11/26/24 1432 11/26/24 1437  BP: (!) 89/57 (!) 98/57  Pulse: 69 74  Weight: 108 lb 0.6 oz (49 kg)     Fetal Status:      Movement: Present    General: Alert, oriented and cooperative. Patient is in no acute distress.  Skin: Skin is warm and dry. No rash noted.   Cardiovascular: Normal heart rate noted  Respiratory: Normal respiratory effort, no problems with respiration noted  Abdomen: Soft, gravid, appropriate for gestational age.  Pain/Pressure: Absent     Pelvic: Cervical exam deferred        Extremities: Normal range of motion.  Edema: None  Mental Status: Normal mood and affect. Normal behavior. Normal judgment and thought content.      10/09/2024    5:00 PM 10/04/2024    2:12 PM 08/21/2021   11:06 AM  Depression screen PHQ 2/9  Decreased Interest 1 0 0  Down, Depressed, Hopeless 3 0 0  PHQ - 2 Score 4 0 0  Altered sleeping 1 0 0  Tired, decreased energy 1 1 0  Change in appetite 1 1 0  Feeling bad or failure about yourself  2  0 0  Trouble concentrating 2 0 0  Moving slowly or fidgety/restless 1 1 0  Suicidal thoughts 3 0 0  PHQ-9 Score 15  3  0      Data saved with a previous flowsheet row definition        10/04/2024    2:12 PM 08/21/2021   11:07 AM 08/06/2021    2:20 PM 07/15/2021   10:22 AM  GAD 7 : Generalized Anxiety Score  Nervous, Anxious, on Edge 0 0 0 0  Control/stop worrying 1 0 0 0  Worry too much - different things 0 0 1 0  Trouble relaxing 0 0 0 0  Restless 0 0 0 0  Easily annoyed or irritable 0 0 1 0  Afraid - awful might happen 0 0 0 0  Total GAD 7 Score 1 0 2 0    Assessment and Plan:  Pregnancy: G3P1011 at [redacted]w[redacted]d 1. [redacted] weeks gestation of pregnancy (Primary) AFP NV  2. Supervision of other normal pregnancy, antepartum   Preterm labor symptoms and general obstetric precautions including but not limited to vaginal bleeding, contractions, leaking of fluid and fetal movement were reviewed in detail with the patient. Please refer to After Visit Summary for other counseling recommendations.   Return in about 4 weeks (around 12/24/2024).  Future Appointments  Date Time Provider Department Center  12/05/2024  1:00 PM [facility] PROVIDER 1 [facility]  12/05/2024  1:30 PM [facility] US3 [facility]  Lynwood Solomons, MD "

## 2024-12-05 ENCOUNTER — Ambulatory Visit

## 2024-12-05 ENCOUNTER — Ambulatory Visit: Admitting: Obstetrics and Gynecology

## 2024-12-05 ENCOUNTER — Other Ambulatory Visit: Payer: Self-pay | Admitting: *Deleted

## 2024-12-05 VITALS — BP 109/64

## 2024-12-05 DIAGNOSIS — O361910 Maternal care for other isoimmunization, first trimester, not applicable or unspecified: Secondary | ICD-10-CM | POA: Diagnosis not present

## 2024-12-05 DIAGNOSIS — O361920 Maternal care for other isoimmunization, second trimester, not applicable or unspecified: Secondary | ICD-10-CM

## 2024-12-05 DIAGNOSIS — Z3A19 19 weeks gestation of pregnancy: Secondary | ICD-10-CM | POA: Diagnosis not present

## 2024-12-05 DIAGNOSIS — O358XX0 Maternal care for other (suspected) fetal abnormality and damage, not applicable or unspecified: Secondary | ICD-10-CM

## 2024-12-05 NOTE — Progress Notes (Signed)
 Maternal-Fetal Medicine Consultation  Name: Melissa [name]  MRN: [id number]  GA: [id number] [redacted]w[redacted]d   Patient is here for fetal anatomy scan.   On cell-free fetal DNA screening, the risks of aneuploidies are not increased. MSAFP screening showed low risk for open-neural tube defects.  She has anti-Kell antibodies (titer 1: 4).  Patient probably acquired the antibiotics after blood transfusion following termination of pregnancy earlier this year.  UNITY screening showed the fetus is Kell negative.  Ultrasound Fetal biometry is consistent with her previously-established dates. Normal amniotic fluid.  No makers of aneuploidies or fetal structural defects are seen.    Middle cerebral artery Doppler showed normal peak systolic velocity measurements (no evidence of fetal anemia).  Patient understands the limitations of ultrasound in detecting fetal anomalies.  I reassured the patient of the findings including MCA Doppler study. Since UNITY is a new screening technology, I recommend a follow-up MCA Doppler study later in pregnancy.  Recommendations -Fetal growth assessment and MCA Doppler in 10 weeks.     Consultation including face-to-face (more than 50%) counseling 10 minutes.

## 2024-12-27 ENCOUNTER — Ambulatory Visit: Admitting: Family Medicine

## 2024-12-27 VITALS — BP 97/58 | HR 92 | Wt 118.1 lb

## 2024-12-27 DIAGNOSIS — O361990 Maternal care for other isoimmunization, unspecified trimester, not applicable or unspecified: Secondary | ICD-10-CM | POA: Diagnosis not present

## 2024-12-27 DIAGNOSIS — Z3A22 22 weeks gestation of pregnancy: Secondary | ICD-10-CM

## 2024-12-27 DIAGNOSIS — Z3480 Encounter for supervision of other normal pregnancy, unspecified trimester: Secondary | ICD-10-CM

## 2024-12-27 DIAGNOSIS — O99820 Streptococcus B carrier state complicating pregnancy: Secondary | ICD-10-CM

## 2024-12-27 NOTE — Progress Notes (Signed)
 "  PRENATAL VISIT NOTE  Subjective:  Melissa [name] is a 24 y.o. G3P1011 at [redacted]w[redacted]d being seen today for ongoing prenatal care.  She is currently monitored for the following issues for this high-risk pregnancy and has Supervision of low-risk pregnancy; GBS (group B Streptococcus carrier), +RV culture, currently pregnant; Vaginal delivery; Supervision of normal intrauterine pregnancy in multigravida; Kell isoimmunization during pregnancy; MDD (major depressive disorder); and MDD (major depressive disorder), recurrent severe, without psychosis (HCC) on their problem list.  Patient reports no complaints.  Contractions: Not present. Vag. Bleeding: None.  Movement: Present. Denies leaking of fluid.   The following portions of the patient's history were reviewed and updated as appropriate: allergies, current medications, past family history, past medical history, past social history, past surgical history and problem list.   Objective:   Vitals:   12/27/24 1552  BP: (!) 97/58  Pulse: 92  Weight: 118 lb 1.3 oz (53.6 kg)    Fetal Status:  Fetal Heart Rate (bpm): 139   Movement: Present    General: Alert, oriented and cooperative. Patient is in no acute distress.  Skin: Skin is warm and dry. No rash noted.   Cardiovascular: Normal heart rate noted  Respiratory: Normal respiratory effort, no problems with respiration noted  Abdomen: Soft, gravid, appropriate for gestational age.  Pain/Pressure: Absent     Pelvic: Cervical exam deferred        Extremities: Normal range of motion.  Edema: None  Mental Status: Normal mood and affect. Normal behavior. Normal judgment and thought content.      10/09/2024    5:00 PM 10/04/2024    2:12 PM 08/21/2021   11:06 AM  Depression screen PHQ 2/9  Decreased Interest 1 0 0  Down, Depressed, Hopeless 3 0 0  PHQ - 2 Score 4 0 0  Altered sleeping 1 0 0  Tired, decreased energy 1 1 0  Change in appetite 1 1 0  Feeling bad or failure about yourself  2 0 0   Trouble concentrating 2 0 0  Moving slowly or fidgety/restless 1 1 0  Suicidal thoughts 3 0 0  PHQ-9 Score 15  3  0      Data saved with a previous flowsheet row definition        10/04/2024    2:12 PM 08/21/2021   11:07 AM 08/06/2021    2:20 PM 07/15/2021   10:22 AM  GAD 7 : Generalized Anxiety Score  Nervous, Anxious, on Edge 0  0  0  0   Control/stop worrying 1  0  0  0   Worry too much - different things 0  0  1  0   Trouble relaxing 0  0  0  0   Restless 0  0  0  0   Easily annoyed or irritable 0  0  1  0   Afraid - awful might happen 0  0  0  0   Total GAD 7 Score 1 0 2 0     Data saved with a previous flowsheet row definition    Assessment and Plan:  Pregnancy: G3P1011 at [redacted]w[redacted]d 1. [redacted] weeks gestation of pregnancy (Primary)  2. Encounter for supervision of normal intrauterine pregnancy in multigravida, antepartum FHT normal  3. Kell isoimmunization during pregnancy, antepartum, single or unspecified fetus UNITY neg. MFM still recommends MCA doppler and growth  4. GBS (group B Streptococcus carrier), +RV culture, currently pregnant Intrapartum PPx.   Preterm labor symptoms  and general obstetric precautions including but not limited to vaginal bleeding, contractions, leaking of fluid and fetal movement were reviewed in detail with the patient. Please refer to After Visit Summary for other counseling recommendations.   No follow-ups on file.  Future Appointments  Date Time Provider Department Center  01/25/2025  8:35 AM Abigail, Rollo DASEN, MD [facility] None  02/08/2025  9:55 AM Synthia Raisin, CNM [facility] None  02/14/2025  2:15 PM [facility] PROVIDER 1 [facility]  02/14/2025  2:30 PM [facility] US1 [facility]  02/22/2025  9:55 AM Synthia Raisin, CNM [facility] None  03/08/2025  9:55 AM Abigail, Rollo DASEN, MD [facility] None  03/22/2025  9:55 AM Anyanwu, Gloris LABOR, MD [facility] None  03/29/2025  9:55 AM [facility] MIDWIFE [facility] None  04/05/2025  9:55 AM [facility] MIDWIFE [facility] None   04/19/2025  9:55 AM [facility] MIDWIFE [facility] None  04/26/2025  9:55 AM [facility] MIDWIFE [facility] None    Lang JINNY Peel, DO "

## 2025-01-25 ENCOUNTER — Encounter: Admitting: Obstetrics and Gynecology

## 2025-02-08 ENCOUNTER — Encounter

## 2025-02-14 ENCOUNTER — Ambulatory Visit

## 2025-02-22 ENCOUNTER — Encounter

## 2025-03-08 ENCOUNTER — Encounter: Admitting: Obstetrics and Gynecology

## 2025-03-22 ENCOUNTER — Encounter: Admitting: Obstetrics & Gynecology

## 2025-03-29 ENCOUNTER — Encounter

## 2025-04-05 ENCOUNTER — Encounter

## 2025-04-19 ENCOUNTER — Encounter

## 2025-04-26 ENCOUNTER — Encounter
